# Patient Record
Sex: Female | Born: 1975 | Race: White | Hispanic: No | Marital: Married | Smoking: Never smoker
Health system: Southern US, Community
[De-identification: ages and names within clinical notes are randomized; demographics above are authoritative.]

## PROBLEM LIST (undated history)

## (undated) DIAGNOSIS — E079 Disorder of thyroid, unspecified: Secondary | ICD-10-CM

## (undated) DIAGNOSIS — R011 Cardiac murmur, unspecified: Secondary | ICD-10-CM

## (undated) DIAGNOSIS — F1910 Other psychoactive substance abuse, uncomplicated: Secondary | ICD-10-CM

## (undated) HISTORY — DX: Cardiac murmur, unspecified: R01.1

## (undated) HISTORY — DX: Other psychoactive substance abuse, uncomplicated: F19.10

## (undated) HISTORY — DX: Disorder of thyroid, unspecified: E07.9

---

## 2018-09-08 ENCOUNTER — Telehealth: Payer: Self-pay

## 2018-09-08 NOTE — Telephone Encounter (Signed)
Left message for patient to call and change appointment for e-visit.

## 2018-09-11 ENCOUNTER — Ambulatory Visit: Payer: Self-pay | Admitting: Physician Assistant

## 2018-11-10 ENCOUNTER — Other Ambulatory Visit: Payer: Self-pay

## 2018-11-10 ENCOUNTER — Other Ambulatory Visit (HOSPITAL_COMMUNITY)
Admission: RE | Admit: 2018-11-10 | Discharge: 2018-11-10 | Disposition: A | Discharge status: Home / Self Care | Payer: BC Managed Care – PPO | Source: Ambulatory Visit | Attending: Physician Assistant | Admitting: Physician Assistant

## 2018-11-10 ENCOUNTER — Other Ambulatory Visit: Payer: Self-pay | Admitting: Physician Assistant

## 2018-11-10 ENCOUNTER — Encounter: Payer: Self-pay | Admitting: Physician Assistant

## 2018-11-10 ENCOUNTER — Ambulatory Visit (INDEPENDENT_AMBULATORY_CARE_PROVIDER_SITE_OTHER): Payer: BC Managed Care – PPO | Admitting: Physician Assistant

## 2018-11-10 VITALS — BP 108/71 | HR 62 | Temp 98.6°F | Resp 16 | Ht 64.0 in | Wt 154.6 lb

## 2018-11-10 DIAGNOSIS — Z0000 Encounter for general adult medical examination without abnormal findings: Secondary | ICD-10-CM | POA: Diagnosis not present

## 2018-11-10 DIAGNOSIS — Z114 Encounter for screening for human immunodeficiency virus [HIV]: Secondary | ICD-10-CM | POA: Diagnosis not present

## 2018-11-10 DIAGNOSIS — Z124 Encounter for screening for malignant neoplasm of cervix: Secondary | ICD-10-CM | POA: Diagnosis present

## 2018-11-10 DIAGNOSIS — Z8639 Personal history of other endocrine, nutritional and metabolic disease: Secondary | ICD-10-CM

## 2018-11-10 DIAGNOSIS — N926 Irregular menstruation, unspecified: Secondary | ICD-10-CM | POA: Diagnosis not present

## 2018-11-10 NOTE — Patient Instructions (Signed)

## 2018-11-10 NOTE — Progress Notes (Signed)
Patient: Anna Richmond, Female    DOB: 11-13-1975, 43 y.o.   MRN: [id number] Visit Date: 11/10/2018  Today's Provider: Trey SailorsAdriana M Jaythan Hinely, PA-C   Chief Complaint  Patient presents with  . New Patient (Initial Visit)   Subjective:     Annual physical exam Anna Richmond is a 43 y.o. female who presents today for health maintenance and complete physical. She feels well. She reports exercising no. She reports she is sleeping well.  She works as a Comptrollertaxonomist. She is living at home with her husband. Does not have children.   She reports history of hyperthyroidism in 20's and did take medication for under 6 months but does not recall which one.  Most recently has had irregular periods. Negative home pregnancy test. Mostly vegan, currently taking iron supplements.   Mother deceased from lung cancer.   PAP: due  Wt Readings from Last 3 Encounters:  11/10/18 154 lb 9.6 oz (70.1 kg)   -----------------------------------------------------------------   Review of Systems  Constitutional: Negative.   HENT: Negative.   Eyes: Negative.   Respiratory: Negative.   Cardiovascular: Negative.   Gastrointestinal: Negative.   Endocrine: Negative.   Genitourinary: Negative.   Musculoskeletal: Negative.   Skin: Negative.   Allergic/Immunologic: Negative.   Neurological: Negative.   Hematological: Negative.   Psychiatric/Behavioral: Negative.     Social History      She  reports that she has never smoked. She has never used smokeless tobacco.       Social History   Socioeconomic History  . Marital status: Married    Spouse name: Not on file  . Number of children: Not on file  . Years of education: Not on file  . Highest education level: Not on file  Occupational History  . Not on file  Social Needs  . Financial resource strain: Not on file  . Food insecurity    Worry: Not on file    Inability: Not on file  . Transportation needs    Medical: Not on file   Non-medical: Not on file  Tobacco Use  . Smoking status: Never Smoker  . Smokeless tobacco: Never Used  Substance and Sexual Activity  . Alcohol use: Not on file  . Drug use: Not on file  . Sexual activity: Not on file  Lifestyle  . Physical activity    Days per week: Not on file    Minutes per session: Not on file  . Stress: Not on file  Relationships  . Social Musicianconnections    Talks on phone: Not on file    Gets together: Not on file    Attends religious service: Not on file    Active member of club or organization: Not on file    Attends meetings of clubs or organizations: Not on file    Relationship status: Not on file  Other Topics Concern  . Not on file  Social History Narrative  . Not on file    Past Medical History:  Diagnosis Date  . Heart murmur   . Thyroid disease      There are no active problems to display for this patient.   History reviewed. No pertinent surgical history.  Family History        Family Status  Relation Name Status  . Mother  Deceased at age 41       cancer lung  . Father  Alive  . MGM  Deceased  . PGM  Deceased  .  PGF  Deceased  . Brother  Alive  . Brother  Alive        Her family history includes Cancer in her mother; Dementia in her paternal grandfather; Diabetes in her paternal grandfather; Hyperlipidemia in her father; Hypertension in her maternal grandmother and paternal grandmother; Stroke in her paternal grandmother; Thyroid disease in her mother.      Allergies  Allergen Reactions  . Azithromycin Nausea And Vomiting  . Sulfa Antibiotics Swelling and Rash    No current outpatient medications on file.   Patient Care Team: Paulene Floor as PCP - General (Physician Assistant)    Objective:    Vitals: BP 108/71 (BP Location: Left Arm, Patient Position: Sitting, Cuff Size: Normal)   Pulse 62   Temp 98.6 F (37 C) (Oral)   Resp 16   Ht 5\' 4"  (1.626 m)   Wt 154 lb 9.6 oz (70.1 kg)   BMI 26.54 kg/m     Vitals:   11/10/18 1444  BP: 108/71  Pulse: 62  Resp: 16  Temp: 98.6 F (37 C)  TempSrc: Oral  Weight: 154 lb 9.6 oz (70.1 kg)  Height: 5\' 4"  (1.626 m)     Physical Exam Exam conducted with a chaperone present.  Constitutional:      Appearance: Normal appearance.  Cardiovascular:     Rate and Rhythm: Normal rate and regular rhythm.  Pulmonary:     Breath sounds: Normal breath sounds.  Chest:     Breasts:        Right: Normal.        Left: Normal.  Abdominal:     General: Abdomen is flat. Bowel sounds are normal.  Genitourinary:    Exam position: Lithotomy position.     Vagina: Normal.     Cervix: Normal.  Skin:    General: Skin is warm and dry.  Neurological:     Mental Status: She is alert and oriented to person, place, and time. Mental status is at baseline.  Psychiatric:        Mood and Affect: Mood normal.        Behavior: Behavior normal.      Depression Screen PHQ 2/9 Scores 11/10/2018  PHQ - 2 Score 1  PHQ- 9 Score 3       Assessment & Plan:     Routine Health Maintenance and Physical Exam  Exercise Activities and Dietary recommendations Goals   None     Immunization History  Administered Date(s) Administered  . Influenza-Unspecified 04/22/2018  . Tdap 04/22/2018    Health Maintenance  Topic Date Due  . HIV Screening  11/12/1992  . PAP SMEAR-Modifier  11/13/1998  . INFLUENZA VACCINE  12/23/2018  . TETANUS/TDAP  04/22/2028     Discussed health benefits of physical activity, and encouraged her to engage in regular exercise appropriate for her age and condition.    1. Annual physical exam  - CBC with Differential/Platelet - Comprehensive metabolic panel - Lipid Panel With LDL/HDL Ratio - TSH  2. Cervical cancer screening  - Cytology - PAP  3. Screening for HIV (human immunodeficiency virus)  - HIV Antibody (routine testing w rflx)  4. Irregular periods  - Beta HCG, Quant  5. History of hyperthyroidism  The  entirety of the information documented in the History of Present Illness, Review of Systems and Physical Exam were personally obtained by me. Portions of this information were initially documented by Lynford Humphrey, CMA and reviewed by me for thoroughness  and accuracy.   F/u 1 year or sooner if labs return abnormal.   --------------------------------------------------------------------    Trey SailorsAdriana M Deisha Stull, PA-C  [facility]

## 2018-11-11 LAB — CBC WITH DIFFERENTIAL/PLATELET
Basophils Absolute: 0 10*3/uL (ref 0.0–0.2)
Basos: 1 %
EOS (ABSOLUTE): 0.2 10*3/uL (ref 0.0–0.4)
Eos: 2 %
Hematocrit: 39.5 % (ref 34.0–46.6)
Hemoglobin: 13.6 g/dL (ref 11.1–15.9)
Immature Grans (Abs): 0 10*3/uL (ref 0.0–0.1)
Immature Granulocytes: 0 %
Lymphocytes Absolute: 3 10*3/uL (ref 0.7–3.1)
Lymphs: 37 %
MCH: 31.7 pg (ref 26.6–33.0)
MCHC: 34.4 g/dL (ref 31.5–35.7)
MCV: 92 fL (ref 79–97)
Monocytes Absolute: 0.7 10*3/uL (ref 0.1–0.9)
Monocytes: 8 %
Neutrophils Absolute: 4.3 10*3/uL (ref 1.4–7.0)
Neutrophils: 52 %
Platelets: 297 10*3/uL (ref 150–450)
RBC: 4.29 x10E6/uL (ref 3.77–5.28)
RDW: 12.6 % (ref 11.7–15.4)
WBC: 8.1 10*3/uL (ref 3.4–10.8)

## 2018-11-11 LAB — COMPREHENSIVE METABOLIC PANEL
ALT: 14 IU/L (ref 0–32)
AST: 20 IU/L (ref 0–40)
Albumin/Globulin Ratio: 2 (ref 1.2–2.2)
Albumin: 4.7 g/dL (ref 3.8–4.8)
Alkaline Phosphatase: 59 IU/L (ref 39–117)
BUN/Creatinine Ratio: 12 (ref 9–23)
BUN: 13 mg/dL (ref 6–24)
Bilirubin Total: 0.3 mg/dL (ref 0.0–1.2)
CO2: 20 mmol/L (ref 20–29)
Calcium: 9.6 mg/dL (ref 8.7–10.2)
Chloride: 103 mmol/L (ref 96–106)
Creatinine, Ser: 1.06 mg/dL — ABNORMAL HIGH (ref 0.57–1.00)
GFR calc Af Amer: 76 mL/min/{1.73_m2} (ref >59)
GFR calc non Af Amer: 66 mL/min/{1.73_m2} (ref >59)
Globulin, Total: 2.4 g/dL (ref 1.5–4.5)
Glucose: 83 mg/dL (ref 65–99)
Potassium: 4.1 mmol/L (ref 3.5–5.2)
Sodium: 136 mmol/L (ref 134–144)
Total Protein: 7.1 g/dL (ref 6.0–8.5)

## 2018-11-11 LAB — LIPID PANEL WITH LDL/HDL RATIO
Cholesterol, Total: 186 mg/dL (ref 100–199)
HDL: 38 mg/dL — ABNORMAL LOW (ref >39)
LDL Calculated: 119 mg/dL — ABNORMAL HIGH (ref 0–99)
LDl/HDL Ratio: 3.1 ratio (ref 0.0–3.2)
Triglycerides: 145 mg/dL (ref 0–149)
VLDL Cholesterol Cal: 29 mg/dL (ref 5–40)

## 2018-11-11 LAB — TSH: TSH: 5.81 u[IU]/mL — ABNORMAL HIGH (ref 0.450–4.500)

## 2018-11-11 LAB — BETA HCG QUANT (REF LAB): hCG Quant: <1 m[IU]/mL

## 2018-11-11 LAB — HIV ANTIBODY (ROUTINE TESTING W REFLEX): HIV Screen 4th Generation wRfx: NONREACTIVE

## 2018-11-14 ENCOUNTER — Telehealth: Payer: Self-pay | Admitting: Physician Assistant

## 2018-11-14 ENCOUNTER — Telehealth: Payer: Self-pay

## 2018-11-14 LAB — CYTOLOGY - PAP
Diagnosis: NEGATIVE
HPV: NOT DETECTED

## 2018-11-14 NOTE — Telephone Encounter (Signed)
Pt returned call. This message regarding PAP and HPV test was relayed. Pt understood and did not have further questions.  TF

## 2018-11-14 NOTE — Telephone Encounter (Signed)
-----   Message from Adriana M Pollak, PA-C sent at 11/14/2018 11:59 AM EDT ----- °Normal PAP and negative HPV. Repeat 5 years. °

## 2018-11-14 NOTE — Telephone Encounter (Signed)
No answer. Left v/m. Called to relay message TF

## 2018-11-14 NOTE — Telephone Encounter (Signed)
-----   Message from Trinna Post, [place] sent at 11/13/2018  4:45 PM EDT ----- Can we please add on Free T3 and free T4 under dx elevated TSH.

## 2018-11-14 NOTE — Telephone Encounter (Signed)
Test added.   

## 2018-11-14 NOTE — Telephone Encounter (Signed)
-----   Message from Trinna Post, [place] sent at 11/14/2018 11:59 AM EDT ----- Normal PAP and negative HPV. Repeat 5 years.

## 2018-11-15 ENCOUNTER — Telehealth: Payer: Self-pay

## 2018-11-15 MED ORDER — LEVOTHYROXINE SODIUM 25 MCG PO TABS: 25.0000 ug | ORAL_TABLET | Freq: Every day | ORAL | 0 refills | Status: DC

## 2018-11-15 NOTE — Telephone Encounter (Signed)
Patient advised as below. Patient verbalizes understanding and is in agreement with treatment plan.  

## 2018-11-15 NOTE — Telephone Encounter (Signed)
-----   Message from Trinna Post, [place] sent at 11/15/2018  8:52 AM EDT ----- Her thyroid tests showed subclinical hypothyroidism, which is a mild form of hypothyroidism. I think given her history, she might benefit from treatment. If she is agreeable, I will send in levothyroxine 25 mcg and see her in follow up in 4 weeks. If she continues to have irregular periods, I think we should run a few more blood tests and get an ultrasound.

## 2018-11-16 LAB — T4, FREE: Free T4: 0.91 ng/dL (ref 0.82–1.77)

## 2018-11-16 LAB — SPECIMEN STATUS REPORT

## 2018-11-16 LAB — T3, FREE: T3, Free: 2.7 pg/mL (ref 2.0–4.4)

## 2018-12-12 NOTE — Progress Notes (Signed)
       Patient: Anna Richmond Female    DOB: 1975/08/01   43 y.o.   MRN: [id number] Visit Date: 12/12/2018  Today's Provider: Trinna Post, PA-C   Chief Complaint  Patient presents with  . Follow-up   Subjective:     HPI  Virtual Visit via Video Note  I connected with Anna Richmond on 12/12/18 at  8:20 AM EDT by a video enabled telemedicine application and verified that I am speaking with the correct person using two identifiers.  Location: Patient: Home Provider: Home   I discussed the limitations of evaluation and management by telemedicine and the availability of in person appointments. The patient expressed understanding and agreed to proceed.  Subclinical Hypothyroidism: started synthroid 25 mcg daily. She is taking it consistently on an empty stomach 30 minutes before meal with a glass of water. Reports she had some hot flashes initially but this has resolved and she feels well taking the medication.   Lab Results  Component Value Date   TSH 5.810 (H) 11/10/2018       Allergies  Allergen Reactions  . Azithromycin Nausea And Vomiting  . Sulfa Antibiotics Swelling and Rash     Current Outpatient Medications:  .  levothyroxine (SYNTHROID) 25 MCG tablet, Take 1 tablet (25 mcg total) by mouth daily before breakfast., Disp: 30 tablet, Rfl: 0  Review of Systems  All other systems reviewed and are negative.   Social History   Tobacco Use  . Smoking status: Never Smoker  . Smokeless tobacco: Never Used  Substance Use Topics  . Alcohol use: Not on file      Objective:   There were no vitals taken for this visit. There were no vitals filed for this visit.   Physical Exam Constitutional:      Appearance: Normal appearance.  Neurological:     Mental Status: She is alert.  Psychiatric:        Mood and Affect: Mood normal.        Behavior: Behavior normal.      No results found for any visits on 12/13/18.     Assessment & Plan    1.  Hypothyroidism, unspecified type  Started on 25 mcg synthroid. If TSH normal, continue and f/u 1 year. If abnormal, change dose and follow up ~ 3 months. She will continue to monitor her menstrual cycle and call back if irregularities persist.   - TSH  The entirety of the information documented in the History of Present Illness, Review of Systems and Physical Exam were personally obtained by me. Portions of this information were initially documented by Doran Clay, LPN and reviewed by me for thoroughness and accuracy.          Trinna Post, PA-C  [facility]

## 2018-12-13 ENCOUNTER — Ambulatory Visit (INDEPENDENT_AMBULATORY_CARE_PROVIDER_SITE_OTHER): Payer: BC Managed Care – PPO | Admitting: Physician Assistant

## 2018-12-13 ENCOUNTER — Other Ambulatory Visit: Payer: Self-pay

## 2018-12-13 DIAGNOSIS — E039 Hypothyroidism, unspecified: Secondary | ICD-10-CM | POA: Diagnosis not present

## 2018-12-13 NOTE — Patient Instructions (Signed)
Hypothyroidism  Hypothyroidism is when the thyroid gland does not make enough of certain hormones (it is underactive). The thyroid gland is a small gland located in the lower front part of the neck, just in front of the windpipe (trachea). This gland makes hormones that help control how the body uses food for energy (metabolism) as well as how the heart and brain function. These hormones also play a role in keeping your bones strong. When the thyroid is underactive, it produces too little of the hormones thyroxine (T4) and triiodothyronine (T3). What are the causes? This condition may be caused by:  Hashimoto's disease. This is a disease in which the body's disease-fighting system (immune system) attacks the thyroid gland. This is the most common cause.  Viral infections.  Pregnancy.  Certain medicines.  Birth defects.  Past radiation treatments to the head or neck for cancer.  Past treatment with radioactive iodine.  Past exposure to radiation in the environment.  Past surgical removal of part or all of the thyroid.  Problems with a gland in the center of the brain (pituitary gland).  Lack of enough iodine in the diet. What increases the risk? You are more likely to develop this condition if:  You are female.  You have a family history of thyroid conditions.  You use a medicine called lithium.  You take medicines that affect the immune system (immunosuppressants). What are the signs or symptoms? Symptoms of this condition include:  Feeling as though you have no energy (lethargy).  Not being able to tolerate cold.  Weight gain that is not explained by a change in diet or exercise habits.  Lack of appetite.  Dry skin.  Coarse hair.  Menstrual irregularity.  Slowing of thought processes.  Constipation.  Sadness or depression. How is this diagnosed? This condition may be diagnosed based on:  Your symptoms, your medical history, and a physical exam.  Blood  tests. You may also have imaging tests, such as an ultrasound or MRI. How is this treated? This condition is treated with medicine that replaces the thyroid hormones that your body does not make. After you begin treatment, it may take several weeks for symptoms to go away. Follow these instructions at home:  Take over-the-counter and prescription medicines only as told by your health care provider.  If you start taking any new medicines, tell your health care provider.  Keep all follow-up visits as told by your health care provider. This is important. ? As your condition improves, your dosage of thyroid hormone medicine may change. ? You will need to have blood tests regularly so that your health care provider can monitor your condition. Contact a health care provider if:  Your symptoms do not get better with treatment.  You are taking thyroid replacement medicine and you: ? Sweat a lot. ? Have tremors. ? Feel anxious. ? Lose weight rapidly. ? Cannot tolerate heat. ? Have emotional swings. ? Have diarrhea. ? Feel weak. Get help right away if you have:  Chest pain.  An irregular heartbeat.  A rapid heartbeat.  Difficulty breathing. Summary  Hypothyroidism is when the thyroid gland does not make enough of certain hormones (it is underactive).  When the thyroid is underactive, it produces too little of the hormones thyroxine (T4) and triiodothyronine (T3).  The most common cause is Hashimoto's disease, a disease in which the body's disease-fighting system (immune system) attacks the thyroid gland. The condition can also be caused by viral infections, medicine, pregnancy, or past   radiation treatment to the head or neck.  Symptoms may include weight gain, dry skin, constipation, feeling as though you do not have energy, and not being able to tolerate cold.  This condition is treated with medicine to replace the thyroid hormones that your body does not make. This information  is not intended to replace advice given to you by your health care provider. Make sure you discuss any questions you have with your health care provider. Document Released: 05/10/2005 Document Revised: 04/22/2017 Document Reviewed: 04/20/2017 Elsevier Patient Education  2020 [employer]  

## 2018-12-14 ENCOUNTER — Other Ambulatory Visit: Payer: Self-pay | Admitting: Physician Assistant

## 2018-12-14 LAB — TSH: TSH: 2.7 u[IU]/mL (ref 0.450–4.500)

## 2018-12-14 MED ORDER — LEVOTHYROXINE SODIUM 25 MCG PO TABS: 25.0000 ug | ORAL_TABLET | Freq: Every day | ORAL | 2 refills | Status: DC

## 2019-01-19 ENCOUNTER — Other Ambulatory Visit: Payer: Self-pay

## 2019-01-19 DIAGNOSIS — Z20822 Contact with and (suspected) exposure to covid-19: Secondary | ICD-10-CM

## 2019-01-21 LAB — NOVEL CORONAVIRUS, NAA: SARS-CoV-2, NAA: NOT DETECTED

## 2019-03-21 ENCOUNTER — Encounter: Payer: Self-pay | Admitting: Physician Assistant

## 2019-03-21 DIAGNOSIS — E039 Hypothyroidism, unspecified: Secondary | ICD-10-CM

## 2019-03-27 LAB — TSH: TSH: 3.91 u[IU]/mL (ref 0.450–4.500)

## 2019-03-30 ENCOUNTER — Encounter: Payer: Self-pay | Admitting: Physician Assistant

## 2019-03-30 ENCOUNTER — Ambulatory Visit: Payer: BC Managed Care – PPO | Admitting: Physician Assistant

## 2019-03-30 ENCOUNTER — Other Ambulatory Visit: Payer: Self-pay

## 2019-03-30 VITALS — BP 105/51 | HR 72 | Temp 97.5°F | Wt 154.4 lb

## 2019-03-30 DIAGNOSIS — N926 Irregular menstruation, unspecified: Secondary | ICD-10-CM

## 2019-03-30 DIAGNOSIS — E039 Hypothyroidism, unspecified: Secondary | ICD-10-CM

## 2019-03-30 DIAGNOSIS — Z23 Encounter for immunization: Secondary | ICD-10-CM

## 2019-03-30 NOTE — Patient Instructions (Signed)
Hypothyroidism  Hypothyroidism is when the thyroid gland does not make enough of certain hormones (it is underactive). The thyroid gland is a small gland located in the lower front part of the neck, just in front of the windpipe (trachea). This gland makes hormones that help control how the body uses food for energy (metabolism) as well as how the heart and brain function. These hormones also play a role in keeping your bones strong. When the thyroid is underactive, it produces too little of the hormones thyroxine (T4) and triiodothyronine (T3). What are the causes? This condition may be caused by:  Hashimoto's disease. This is a disease in which the body's disease-fighting system (immune system) attacks the thyroid gland. This is the most common cause.  Viral infections.  Pregnancy.  Certain medicines.  Birth defects.  Past radiation treatments to the head or neck for cancer.  Past treatment with radioactive iodine.  Past exposure to radiation in the environment.  Past surgical removal of part or all of the thyroid.  Problems with a gland in the center of the brain (pituitary gland).  Lack of enough iodine in the diet. What increases the risk? You are more likely to develop this condition if:  You are female.  You have a family history of thyroid conditions.  You use a medicine called lithium.  You take medicines that affect the immune system (immunosuppressants). What are the signs or symptoms? Symptoms of this condition include:  Feeling as though you have no energy (lethargy).  Not being able to tolerate cold.  Weight gain that is not explained by a change in diet or exercise habits.  Lack of appetite.  Dry skin.  Coarse hair.  Menstrual irregularity.  Slowing of thought processes.  Constipation.  Sadness or depression. How is this diagnosed? This condition may be diagnosed based on:  Your symptoms, your medical history, and a physical exam.  Blood  tests. You may also have imaging tests, such as an ultrasound or MRI. How is this treated? This condition is treated with medicine that replaces the thyroid hormones that your body does not make. After you begin treatment, it may take several weeks for symptoms to go away. Follow these instructions at home:  Take over-the-counter and prescription medicines only as told by your health care provider.  If you start taking any new medicines, tell your health care provider.  Keep all follow-up visits as told by your health care provider. This is important. ? As your condition improves, your dosage of thyroid hormone medicine may change. ? You will need to have blood tests regularly so that your health care provider can monitor your condition. Contact a health care provider if:  Your symptoms do not get better with treatment.  You are taking thyroid replacement medicine and you: ? Sweat a lot. ? Have tremors. ? Feel anxious. ? Lose weight rapidly. ? Cannot tolerate heat. ? Have emotional swings. ? Have diarrhea. ? Feel weak. Get help right away if you have:  Chest pain.  An irregular heartbeat.  A rapid heartbeat.  Difficulty breathing. Summary  Hypothyroidism is when the thyroid gland does not make enough of certain hormones (it is underactive).  When the thyroid is underactive, it produces too little of the hormones thyroxine (T4) and triiodothyronine (T3).  The most common cause is Hashimoto's disease, a disease in which the body's disease-fighting system (immune system) attacks the thyroid gland. The condition can also be caused by viral infections, medicine, pregnancy, or past   radiation treatment to the head or neck.  Symptoms may include weight gain, dry skin, constipation, feeling as though you do not have energy, and not being able to tolerate cold.  This condition is treated with medicine to replace the thyroid hormones that your body does not make. This information  is not intended to replace advice given to you by your health care provider. Make sure you discuss any questions you have with your health care provider. Document Released: 05/10/2005 Document Revised: 04/22/2017 Document Reviewed: 04/20/2017 Elsevier Patient Education  2020 [employer]  

## 2019-03-30 NOTE — Progress Notes (Signed)
Patient: Anna Richmond Female    DOB: Apr 12, 1976   43 y.o.   MRN: [id number] Visit Date: 03/30/2019  Today's Provider: Trinna Post, PA-C   Chief Complaint  Patient presents with  . Hypothyroidism   Subjective:     HPI  Hypothyroid, follow-up:  TSH  Date Value Ref Range Status  03/26/2019 3.910 0.450 - 4.500 uIU/mL Final  12/13/2018 2.700 0.450 - 4.500 uIU/mL Final  11/10/2018 5.810 (H) 0.450 - 4.500 uIU/mL Final   Wt Readings from Last 3 Encounters:  03/30/19 154 lb 6.4 oz (70 kg)  11/10/18 154 lb 9.6 oz (70.1 kg)    She was last seen for hypothyroid 4 months ago.  Management since that visit includes continue Synthroid 25 mcg. She reports good compliance with treatment. She is not having side effects.  She is not exercising. She is experiencing none She denies change in energy level, diarrhea, nervousness and palpitations Weight trend: stable  Continues to have irregular menstrual bleeding with spotting.  ------------------------------------------------------------------------     Allergies  Allergen Reactions  . Azithromycin Nausea And Vomiting  . Sulfa Antibiotics Swelling and Rash     Current Outpatient Medications:  .  levothyroxine (SYNTHROID) 25 MCG tablet, Take 1 tablet (25 mcg total) by mouth daily before breakfast., Disp: 90 tablet, Rfl: 2  Review of Systems  Constitutional: Negative.   Respiratory: Negative.   Genitourinary: Negative.   Neurological: Negative.     Social History   Tobacco Use  . Smoking status: Never Smoker  . Smokeless tobacco: Never Used  Substance Use Topics  . Alcohol use: Not on file      Objective:   BP (!) 105/51 (BP Location: Left Arm, Patient Position: Sitting, Cuff Size: Normal)   Pulse 72   Temp (!) 97.5 F (36.4 C) (Temporal)   Wt 154 lb 6.4 oz (70 kg)   LMP 03/01/2019 (Exact Date)   BMI 26.50 kg/m  Vitals:   03/30/19 1446  BP: (!) 105/51  Pulse: 72  Temp: (!) 97.5 F  (36.4 C)  TempSrc: Temporal  Weight: 154 lb 6.4 oz (70 kg)  Body mass index is 26.5 kg/m.   Physical Exam Constitutional:      Appearance: Normal appearance.  Cardiovascular:     Rate and Rhythm: Normal rate.     Heart sounds: Normal heart sounds.  Pulmonary:     Effort: Pulmonary effort is normal.  Skin:    General: Skin is warm and dry.  Neurological:     Mental Status: She is alert and oriented to person, place, and time. Mental status is at baseline.  Psychiatric:        Mood and Affect: Mood normal.        Behavior: Behavior normal.      No results found for any visits on 03/30/19.     Assessment & Plan    1. Hypothyroidism, unspecified type  Normal, no dose adjustment necessary.  2. Irregular menstrual bleeding  Would suggest she see a specialist, referral has been made.   - Ambulatory referral to Obstetrics / Gynecology  3. Need for influenza vaccination  - Flu Vaccine QUAD 6+ mos PF IM (Fluarix Quad PF)  The entirety of the information documented in the History of Present Illness, Review of Systems and Physical Exam were personally obtained by me. Portions of this information were initially documented by [facility], CMA and reviewed by me for thoroughness and accuracy.   F/u  PRN      Trey Sailors, PA-C  [facility]

## 2019-04-02 ENCOUNTER — Telehealth: Payer: Self-pay | Admitting: Obstetrics & Gynecology

## 2019-04-02 NOTE — Telephone Encounter (Signed)
[facility] referring for Irregular menstrual bleeding. Called and left voicemail for patient to call back to be schedule

## 2019-04-08 NOTE — Progress Notes (Signed)
Trey Sailors, PA-C   Chief Complaint  Patient presents with  . Metrorrhagia    had spotting in between last two cycles, not every month, no abnormal pain or heavy flows    HPI:      [name] is a 43 y.o.  who LMP was Patient's last menstrual period was 03/01/2019 (exact date)., presents today for NP eval of irregular bleeding for 2 cycles, referred by PCP. Menses usually Q29-40 days now (used to be Q30 days), last 4 days, mod flow, mild dysmen for 1 day, improved with NSAIDs/heating pad. Had mid cycle BTB 3/20 but also with subclinical hypothyroidism at that time. Now on syntrhroid 25 mcg dailly with normal thyroid 11/20. Had sx again 10/20 menses. BTB was 10 days, light, dark blood. No dysmen.  Pt is sex active, no BC. Never been pregnant. No dyspareunia, postcoital bleeding. Did OCPs, patch, nuvaring in 20s without side effects. No hx of HTN, DVTs, migraines with aura, tobacco use.  No FH breast/ovar ca. Had incidental finding of ovar cyst on CT in past. Normal pap 6/20.  Patient Active Problem List   Diagnosis Date Noted  . Thyroid disease   . Heart murmur     History reviewed. No pertinent surgical history.  Family History  Problem Relation Age of Onset  . Cancer Mother   . Thyroid disease Mother   . Hyperlipidemia Father   . Hypertension Maternal Grandmother   . Hypertension Paternal Grandmother   . Stroke Paternal Grandmother   . Diabetes Paternal Grandfather   . Dementia Paternal Grandfather     Social History   Socioeconomic History  . Marital status: Married    Spouse name: Not on file  . Number of children: Not on file  . Years of education: Not on file  . Highest education level: Not on file  Occupational History  . Not on file  Social Needs  . Financial resource strain: Not on file  . Food insecurity    Worry: Not on file    Inability: Not on file  . Transportation needs    Medical: Not on file    Non-medical: Not on file   Tobacco Use  . Smoking status: Never Smoker  . Smokeless tobacco: Never Used  Substance and Sexual Activity  . Alcohol use: Yes  . Drug use: Never  . Sexual activity: Yes    Birth control/protection: None  Lifestyle  . Physical activity    Days per week: Not on file    Minutes per session: Not on file  . Stress: Not on file  Relationships  . Social Musician on phone: Not on file    Gets together: Not on file    Attends religious service: Not on file    Active member of club or organization: Not on file    Attends meetings of clubs or organizations: Not on file    Relationship status: Not on file  . Intimate partner violence    Fear of current or ex partner: Not on file    Emotionally abused: Not on file    Physically abused: Not on file    Forced sexual activity: Not on file  Other Topics Concern  . Not on file  Social History Narrative  . Not on file    Outpatient Medications Prior to Visit  Medication Sig Dispense Refill  . levothyroxine (SYNTHROID) 25 MCG tablet Take 1 tablet (25 mcg total) by mouth  daily before breakfast. 90 tablet 2   No facility-administered medications prior to visit.      ROS:  Review of Systems  Constitutional: Positive for fatigue. Negative for fever and unexpected weight change.  Respiratory: Negative for cough, shortness of breath and wheezing.   Cardiovascular: Negative for chest pain, palpitations and leg swelling.  Gastrointestinal: Positive for constipation. Negative for blood in stool, diarrhea, nausea and vomiting.  Endocrine: Negative for cold intolerance, heat intolerance and polyuria.  Genitourinary: Positive for vaginal bleeding. Negative for dyspareunia, dysuria, flank pain, frequency, genital sores, hematuria, menstrual problem, pelvic pain, urgency, vaginal discharge and vaginal pain.  Musculoskeletal: Negative for back pain, joint swelling and myalgias.  Skin: Negative for rash.  Neurological: Negative for  dizziness, syncope, light-headedness, numbness and headaches.  Hematological: Negative for adenopathy.  Psychiatric/Behavioral: Negative for agitation, confusion, sleep disturbance and suicidal ideas. The patient is not nervous/anxious.   BREAST: No symptoms   OBJECTIVE:   Vitals:  BP 90/60   Ht 5\' 4"  (1.626 m)   Wt 156 lb (70.8 kg)   LMP 03/01/2019 (Exact Date)   BMI 26.78 kg/m   Physical Exam Vitals signs reviewed.  Constitutional:      Appearance: She is well-developed.  Neck:     Musculoskeletal: Normal range of motion.  Pulmonary:     Effort: Pulmonary effort is normal.  Genitourinary:    General: Normal vulva.     Pubic Area: No rash.      Labia:        Right: No rash, tenderness or lesion.        Left: No rash, tenderness or lesion.      Vagina: Normal. No vaginal discharge, erythema or tenderness.     Cervix: Normal.     Uterus: Normal. Not enlarged and not tender.      Adnexa: Right adnexa normal and left adnexa normal.       Right: No mass or tenderness.         Left: No mass or tenderness.    Musculoskeletal: Normal range of motion.  Skin:    General: Skin is warm and dry.  Neurological:     General: No focal deficit present.     Mental Status: She is alert and oriented to person, place, and time.  Psychiatric:        Mood and Affect: Mood normal.        Behavior: Behavior normal.        Thought Content: Thought content normal.        Judgment: Judgment normal.     Results: Results for orders placed or performed in visit on 04/09/19 (from the past 24 hour(s))  POCT urine pregnancy     Status: Normal   Collection Time: 04/09/19  9:53 AM  Result Value Ref Range   Preg Test, Ur Negative Negative   ULTRASOUND REPORT  Location: [facility]  Date of Service: 04/09/2019    Indications:Abnormal Uterine Bleeding Findings:  The uterus is anteverted and measures 6.0 x 3.4 x 2.9 cm. Echo texture is homogenous without evidence of focal masses.   The Endometrium measures 2.9 mm.  Right Ovary measures 2.7 x 1.9 x 1.0 cm. It is normal in appearance. Left Ovary measures 2.5 x 2.6 x 1.1 cm. It is normal in appearance. Survey of the adnexa demonstrates no adnexal masses. There is no free fluid in the cul de sac.  Impression: 1. Normal pelvic ultrasound.   Recommendations: 1.Clinical correlation with the patient's  History and Physical Exam.   Deanna ArtisElyse S Fairbanks, RT  Assessment/Plan: Abnormal uterine bleeding (AUB) - Plan: US Transvaginal Non-OB, POCT urine pregnancy; Neg UPT/neg Gyn u/s. Sx twice, once when thyroid abnormal and once since euthyroid. Could be stress-related. Can follow cycles vs BC for cycle control. Pt wants to watch and wait for now. F/u prn.     Return if symptoms worsen or fail to improve.  Jermario Kalmar B. Michaeal Davis, PA-C 04/09/2019 4:07 PM

## 2019-04-09 ENCOUNTER — Other Ambulatory Visit: Payer: Self-pay

## 2019-04-09 ENCOUNTER — Encounter: Payer: Self-pay | Admitting: Obstetrics and Gynecology

## 2019-04-09 ENCOUNTER — Ambulatory Visit (INDEPENDENT_AMBULATORY_CARE_PROVIDER_SITE_OTHER): Payer: BC Managed Care – PPO | Admitting: Obstetrics and Gynecology

## 2019-04-09 ENCOUNTER — Ambulatory Visit (INDEPENDENT_AMBULATORY_CARE_PROVIDER_SITE_OTHER): Payer: BC Managed Care – PPO

## 2019-04-09 VITALS — BP 90/60 | Ht 64.0 in | Wt 156.0 lb

## 2019-04-09 DIAGNOSIS — R011 Cardiac murmur, unspecified: Secondary | ICD-10-CM | POA: Insufficient documentation

## 2019-04-09 DIAGNOSIS — N939 Abnormal uterine and vaginal bleeding, unspecified: Secondary | ICD-10-CM | POA: Diagnosis not present

## 2019-04-09 DIAGNOSIS — Z3202 Encounter for pregnancy test, result negative: Secondary | ICD-10-CM | POA: Diagnosis not present

## 2019-04-09 DIAGNOSIS — E079 Disorder of thyroid, unspecified: Secondary | ICD-10-CM | POA: Insufficient documentation

## 2019-04-09 LAB — POCT URINE PREGNANCY: Preg Test, Ur: NEGATIVE

## 2019-04-09 NOTE — Patient Instructions (Signed)
I value your feedback and entrusting us with your care. If you get a [facility] patient survey, I would appreciate you taking the time to let us know about your experience today. Thank you! 

## 2019-06-06 ENCOUNTER — Telehealth: Payer: BC Managed Care – PPO | Admitting: Nurse Practitioner

## 2019-06-06 DIAGNOSIS — K591 Functional diarrhea: Secondary | ICD-10-CM

## 2019-06-06 DIAGNOSIS — Z20822 Contact with and (suspected) exposure to covid-19: Secondary | ICD-10-CM

## 2019-06-06 DIAGNOSIS — R52 Pain, unspecified: Secondary | ICD-10-CM

## 2019-06-06 DIAGNOSIS — R5081 Fever presenting with conditions classified elsewhere: Secondary | ICD-10-CM

## 2019-06-06 NOTE — Progress Notes (Signed)
E-Visit for Corona Virus Screening  Your current symptoms could be consistent with the coronavirus.  Many health care providers can now test patients at their office but not all are.  [facility] has multiple testing sites. For information on our [place] testing locations and hours go to [email or web address]  We are enrolling you in our [place] for [place] . Daily you will receive a questionnaire within the [facility] [facility] website. Our COVID 19 response team will be monitoring your responses daily.  Testing Information: The COVID-19 Community Testing sites will begin testing BY APPOINTMENT ONLY.  You can schedule online at [email or web address]  If you do not have access to a smart phone or computer you may call [phone] for an appointment.  Testing Locations: Appointment schedule is 8 am to 3:30 pm at all sites  [facility] indoors at [address] [place] [address] [facility]  indoors at [place]. [address], [place], [place] [address] [place] indoors at [address], [place] [address]  Additional testing sites in the Community:  . For [facility] Testing sites in [facility]  FaceUpdate.uy  . For Pop-up testing sites in [place]  BowlDirectory.co.uk  . For Testing sites with regular hours https://onsms.org/[email or web address]  . For [place] [email or web address].si  . For [facility] BasicJet.ca  . For [facility] testing in [place] and [employer] BasicJet.[email or web address]  . For [facility] testing in [facility]   https://lhi.care/[email or web address]  For  more information  about community testing call [phone]   We are enrolling you in our [place] for [place] . Daily you will receive a questionnaire within the [place] website. Our COVID 19 response team will be monitoring your responses daily.  Please quarantine yourself while awaiting your test results. Please stay home for a minimum of 10 days from the first day of illness with improving symptoms and you have had 24 hours of no fever (without the use of Tylenol (Acetaminophen) Motrin (Ibuprofen) or any fever reducing medication).  Also - Do not get tested prior to returning to [facility] because once you have had a positive test the test can stay positive for more then a month in some cases.   You should wear a mask or cloth face covering over your nose and mouth if you must be around other people or animals, including pets (even at home). Try to stay at least 6 feet away from other people. This will protect the people around you.  Please continue good preventive care measures, including:  frequent hand-washing, avoid touching your face, cover coughs/sneezes, stay out of crowds and keep a 6 foot distance from others.  COVID-19 is a respiratory illness with symptoms that are similar to the flu. Symptoms are typically mild to moderate, but there have been cases of severe illness and death due to the virus.   The following symptoms may appear 2-14 days after exposure: . Fever . Cough . Shortness of breath or difficulty breathing . Chills . Repeated shaking with chills . Muscle pain . Headache . Sore throat . New loss of taste or smell . Fatigue . Congestion or runny nose . Nausea or vomiting . Diarrhea  Go to the nearest hospital ED for assessment if fever/cough/breathlessness are severe or illness seems like a threat to life.  It is vitally important that if you feel that you have an infection such as this virus or any other virus that you stay home and away from places  where you may spread it to  others.  You should avoid contact with people age 48 and older.   You can use medication such as delsym if develop a cough  You may also take acetaminophen (Tylenol) as needed for fever.  Reduce your risk of any infection by using the same precautions used for avoiding the common cold or flu:  Marland Kitchen Wash your hands often with soap and warm water for at least 20 seconds.  If soap and water are not readily available, use an alcohol-based hand sanitizer with at least 60% alcohol.  . If coughing or sneezing, cover your mouth and nose by coughing or sneezing into the elbow areas of your shirt or coat, into a tissue or into your sleeve (not your hands). . Avoid shaking hands with others and consider head nods or verbal greetings only. . Avoid touching your eyes, nose, or mouth with unwashed hands.  . Avoid close contact with people who are sick. . Avoid places or events with large numbers of people in one location, like concerts or sporting events. . Carefully consider travel plans you have or are making. . If you are planning any travel outside or inside the [place], visit the CDC's Travelers' Health webpage for the latest health notices. . If you have some symptoms but not all symptoms, continue to monitor at home and seek medical attention if your symptoms worsen. . If you are having a medical emergency, call 911.  HOME CARE . Only take medications as instructed by your medical team. . Drink plenty of fluids and get plenty of rest. . A steam or ultrasonic humidifier can help if you have congestion.   GET HELP RIGHT AWAY IF YOU HAVE EMERGENCY WARNING SIGNS** FOR COVID-19. If you or someone is showing any of these signs seek emergency medical care immediately. Call 911 or proceed to your closest emergency facility if: . You develop worsening high fever. . Trouble breathing . Bluish lips or face . Persistent pain or pressure in the chest . New confusion . Inability to wake or stay awake . You cough up  blood. . Your symptoms become more severe  **This list is not all possible symptoms. Contact your medical provider for any symptoms that are sever or concerning to you.  MAKE SURE YOU   Understand these instructions.  Will watch your condition.  Will get help right away if you are not doing well or get worse.  Your e-visit answers were reviewed by a board certified advanced clinical practitioner to complete your personal care plan.  Depending on the condition, your plan could have included both over the counter or prescription medications.  If there is a problem please reply once you have received a response from your provider.  Your safety is important to [place].  If you have drug allergies check your prescription carefully.    You can use MyChart to ask questions about today's visit, request a non-urgent call back, or ask for a work or school excuse for 24 hours related to this e-Visit. If it has been greater than 24 hours you will need to follow up with your provider, or enter a new e-Visit to address those concerns. You will get an e-mail in the next two days asking about your experience.  I hope that your e-visit has been valuable and will speed your recovery. Thank you for using e-visits.   5-10 minutes spent reviewing and documenting in chart.

## 2019-06-07 ENCOUNTER — Ambulatory Visit: Payer: BC Managed Care – PPO | Attending: Internal Medicine

## 2019-06-07 DIAGNOSIS — Z20822 Contact with and (suspected) exposure to covid-19: Secondary | ICD-10-CM

## 2019-06-09 LAB — NOVEL CORONAVIRUS, NAA: SARS-CoV-2, NAA: NOT DETECTED

## 2019-06-13 ENCOUNTER — Ambulatory Visit (INDEPENDENT_AMBULATORY_CARE_PROVIDER_SITE_OTHER)
Admission: RE | Admit: 2019-06-13 | Discharge: 2019-06-13 | Disposition: A | Discharge status: Home / Self Care | Payer: BC Managed Care – PPO | Source: Ambulatory Visit

## 2019-06-13 DIAGNOSIS — B9789 Other viral agents as the cause of diseases classified elsewhere: Secondary | ICD-10-CM | POA: Diagnosis not present

## 2019-06-13 DIAGNOSIS — J0100 Acute maxillary sinusitis, unspecified: Secondary | ICD-10-CM | POA: Diagnosis not present

## 2019-06-13 MED ORDER — AMOXICILLIN-POT CLAVULANATE 875-125 MG PO TABS: 1.0000 | ORAL_TABLET | Freq: Two times a day (BID) | ORAL | 0 refills | Status: DC

## 2019-06-13 NOTE — ED Provider Notes (Addendum)
Virtual Visit via Video Note:  Anna Richmond  initiated request for Telemedicine visit with [facility] team. I connected with Anna Richmond  on 06/13/2019 at 1027  for a synchronized telemedicine visit using a video enabled HIPPA compliant telemedicine application. I verified that I am speaking with Anna Richmond  using two identifiers. Durward Parcel, FNP  was physically located in a [facility] Urgent care site and [facility] Tamorah Hada was located at a different location.   The limitations of evaluation and management by telemedicine as well as the availability of in-person appointments were discussed. Patient was informed that she  may incur a bill ( including co-pay) for this virtual visit encounter. Anna Richmond  expressed understanding and gave verbal consent to proceed with virtual visit.     History of Present Illness:Anna Richmond  is a 44 y.o. female present via video refill complaint of sinus pressure and sinus pain for the past 5 days.  Reports yellowish-greenish discharge.  Patient tested negative for COVID-19  7 days ago.  Has been using Sudafed and Flonase without relief.  Denies sick exposure to COVID, flu or strep.  Denies recent travel.  Denies aggravating or alleviating symptoms.  Denies previous COVID infection.   Denies fever, chills, fatigue, sore throat, cough, SOB, wheezing, chest pain, nausea, vomiting, changes in bowel or bladder habits.    Past Medical History:  Diagnosis Date  . Heart murmur   . Thyroid disease     Allergies  Allergen Reactions  . Azithromycin Nausea And Vomiting  . Sulfa Antibiotics Swelling and Rash        Observations/Objective: VITALS: Per patient if applicable, see vitals. GENERAL: Alert, appears well and in no acute distress. HEENT: Atraumatic, conjunctiva clear, no obvious abnormalities on inspection of external nose and ears. NECK: Normal movements of the  head and neck. CARDIOPULMONARY: No increased WOB. Speaking in clear sentences. I:E ratio WNL.  MS: Moves all visible extremities without noticeable abnormality. PSYCH: Pleasant and cooperative, well-groomed. Speech normal rate and rhythm. Affect is appropriate. Insight and judgement are appropriate. Attention is focused, linear, and appropriate.  NEURO: CN grossly intact. Oriented as arrived to appointment on time with no prompting. Moves both UE equally.  SKIN: No obvious lesions, wounds, erythema, or cyanosis noted on face or hands.    Assessment and Plan:  Her hx, symptoms consistent with sinusitis.   Prescribed Augmentin Continue to use Flonase and Sudafed for symptom relief   Follow Up Instructions:  Follow up with PCP if symptoms persists.  Return and ER precautions given.     I discussed the assessment and treatment plan with the patient. The patient was provided an opportunity to ask questions and all were answered. The patient agreed with the plan and demonstrated an understanding of the instructions.   The patient was advised to call back or seek an in-person evaluation if the symptoms worsen or if the condition fails to improve as anticipated.  I provided 15 minutes of non-face-to-face time during this encounter.    Durward Parcel, FNP  06/13/2019 1042 AM     Durward Parcel, FNP 06/13/19 1050

## 2019-06-13 NOTE — Discharge Instructions (Signed)
Rest and push fluids Continue  use of Flonase and Sudafed Augmentin prescribed.  Take as directed and to completion Continue with OTC ibuprofen/tylenol as needed for pain Follow up with PCP or [facility] if symptoms persists Return or go to the ED if you have any new or worsening symptoms such as fever, chills, worsening sinus pain/pressure, cough, sore throat, chest pain, shortness of breath, abdominal pain, changes in bowel or bladder habits, etc..Marland Kitchen

## 2019-07-10 ENCOUNTER — Other Ambulatory Visit: Payer: Self-pay | Admitting: Physician Assistant

## 2019-07-10 MED ORDER — LEVOTHYROXINE SODIUM 25 MCG PO TABS: 25.0000 ug | ORAL_TABLET | Freq: Every day | ORAL | 2 refills | Status: DC

## 2019-08-15 ENCOUNTER — Encounter: Payer: Self-pay | Admitting: Physician Assistant

## 2019-10-01 ENCOUNTER — Encounter: Payer: Self-pay | Admitting: Physician Assistant

## 2019-10-01 ENCOUNTER — Ambulatory Visit: Payer: BC Managed Care – PPO | Admitting: Physician Assistant

## 2019-10-01 ENCOUNTER — Other Ambulatory Visit: Payer: Self-pay

## 2019-10-01 VITALS — BP 124/76 | HR 76 | Temp 97.1°F | Wt 156.0 lb

## 2019-10-01 DIAGNOSIS — L0291 Cutaneous abscess, unspecified: Secondary | ICD-10-CM

## 2019-10-01 MED ORDER — DOXYCYCLINE HYCLATE 100 MG PO TABS: 100.0000 mg | ORAL_TABLET | Freq: Two times a day (BID) | ORAL | 0 refills | Status: AC

## 2019-10-01 NOTE — Patient Instructions (Signed)

## 2019-10-01 NOTE — Progress Notes (Signed)
     Established patient visit   Patient: Anna Richmond   DOB: Feb 08, 1976   44 y.o. Female  MRN: [id number] Visit Date: 10/01/2019  Today's healthcare provider: Trey Sailors, PA-C   Chief Complaint  Patient presents with  . Skin Problem  I,Loyde Orth M Annell Canty,acting as a scribe for Trey Sailors, PA-C.,have documented all relevant documentation on the behalf of Trey Sailors, PA-C,as directed by  Trey Sailors, PA-C while in the presence of Trey Sailors, PA-C.  Subjective    HPI  Skin Problem Patient presents today for a possible abscess in her groin area she noticed10 days ago. Patient reports the area is dark/purple in color, drainage, swollen. She states she covered the area neosporin and Band-Aid. She also used hot compress. She reports it has been draining on a constant basis for 10 days.        Medications: Outpatient Medications Prior to Visit  Medication Sig  . levothyroxine (SYNTHROID) 25 MCG tablet Take 1 tablet (25 mcg total) by mouth daily before breakfast.  . [DISCONTINUED] amoxicillin-clavulanate (AUGMENTIN) 875-125 MG tablet Take 1 tablet by mouth every 12 (twelve) hours. (Patient not taking: Reported on 10/01/2019)   No facility-administered medications prior to visit.    Review of Systems     Objective    BP 124/76 (BP Location: Left Arm, Patient Position: Sitting, Cuff Size: Normal)   Pulse 76   Temp (!) 97.1 F (36.2 C) (Temporal)   Wt 156 lb (70.8 kg)   LMP 09/30/2019 (Exact Date)   SpO2 96%   BMI 26.78 kg/m     Physical Exam Constitutional:      Appearance: Normal appearance.  Genitourinary:   Skin:    General: Skin is warm and dry.  Neurological:     Mental Status: She is alert and oriented to person, place, and time. Mental status is at baseline.  Psychiatric:        Mood and Affect: Mood normal.        Behavior: Behavior normal.       No results found for any visits on 10/01/19.  Assessment & Plan      1. Abscess  Trial of abx as below. Counseled if this is not improving at the end of the course we should refer her to general surgery for I&D 2/2 location. - doxycycline (VIBRA-TABS) 100 MG tablet; Take 1 tablet (100 mg total) by mouth 2 (two) times daily for 7 days.  Dispense: 14 tablet; Refill: 0    Return if symptoms worsen or fail to improve.      ITrey Sailors, PA-C, have reviewed all documentation for this visit. The documentation on 10/01/19 for the exam, diagnosis, procedures, and orders are all accurate and complete.    Maryella Shivers  [facility] [phone] (phone) [phone] (fax)  [facility]

## 2019-10-23 ENCOUNTER — Encounter: Payer: Self-pay | Admitting: Physician Assistant

## 2019-10-23 DIAGNOSIS — L02214 Cutaneous abscess of groin: Secondary | ICD-10-CM

## 2019-10-29 ENCOUNTER — Other Ambulatory Visit: Payer: Self-pay

## 2019-10-29 ENCOUNTER — Ambulatory Visit: Payer: BC Managed Care – PPO | Admitting: Surgery

## 2019-10-29 ENCOUNTER — Other Ambulatory Visit: Payer: Self-pay | Admitting: Emergency Medicine

## 2019-10-29 ENCOUNTER — Encounter: Payer: Self-pay | Admitting: Surgery

## 2019-10-29 ENCOUNTER — Telehealth: Payer: Self-pay | Admitting: Emergency Medicine

## 2019-10-29 VITALS — BP 114/68 | HR 80 | Temp 97.7°F | Resp 12 | Ht 64.0 in | Wt 153.0 lb

## 2019-10-29 DIAGNOSIS — L02419 Cutaneous abscess of limb, unspecified: Secondary | ICD-10-CM

## 2019-10-29 DIAGNOSIS — L03119 Cellulitis of unspecified part of limb: Secondary | ICD-10-CM

## 2019-10-29 NOTE — Telephone Encounter (Signed)
Called pt with no answer left vm to call back to the office.    She needs to made of the following when calls back: Since there is a chance of pregnancy, urine preg test ordered to confirm. Order is placed in Epic. Pt is to go to the lab at the [facility] to get this done. No appt required.

## 2019-10-29 NOTE — Patient Instructions (Addendum)
Your CT of the Pelvis is scheduled for 11/02/19 at 3:30pm at the [employer]. Arrival 3pm. Nothing to eat or drink 4 hours prior.    You will need to pick up your prep kit before your CT appointment. You can pick this up at the [facility] or [facility].  Please see your appointment below. Call the office if you have any questions or concerns.   Skin Abscess  A skin abscess is an infected area on or under your skin that contains a collection of pus and other material. An abscess may also be called a furuncle, carbuncle, or boil. An abscess can occur in or on almost any part of your body. Some abscesses break open (rupture) on their own. Most continue to get worse unless they are treated. The infection can spread deeper into the body and eventually into your blood, which can make you feel ill. Treatment usually involves draining the abscess. What are the causes? An abscess occurs when germs, like bacteria, pass through your skin and cause an infection. This may be caused by:  A scrape or cut on your skin.  A puncture wound through your skin, including a needle injection or insect bite.  Blocked oil or sweat glands.  Blocked and infected hair follicles.  A cyst that forms beneath your skin (sebaceous cyst) and becomes infected. What increases the risk? This condition is more likely to develop in people who:  Have a weak body defense system (immune system).  Have diabetes.  Have dry and irritated skin.  Get frequent injections or use illegal IV drugs.  Have a foreign body in a wound, such as a splinter.  Have problems with their lymph system or veins. What are the signs or symptoms? Symptoms of this condition include:  A painful, firm bump under the skin.  A bump with pus at the top. This may break through the skin and drain. Other symptoms include:  Redness surrounding the abscess site.  Warmth.  Swelling of the lymph nodes  (glands) near the abscess.  Tenderness.  A sore on the skin. How is this diagnosed? This condition may be diagnosed based on:  A physical exam.  Your medical history.  A sample of pus. This may be used to find out what is causing the infection.  Blood tests.  Imaging tests, such as an ultrasound, CT scan, or MRI. How is this treated? A small abscess that drains on its own may not need treatment. Treatment for larger abscesses may include:  Moist heat or heat pack applied to the area several times a day.  A procedure to drain the abscess (incision and drainage).  Antibiotic medicines. For a severe abscess, you may first get antibiotics through an IV and then change to antibiotics by mouth. Follow these instructions at home: Medicines   Take over-the-counter and prescription medicines only as told by your health care provider.  If you were prescribed an antibiotic medicine, take it as told by your health care provider. Do not stop taking the antibiotic even if you start to feel better. Abscess care   If you have an abscess that has not drained, apply heat to the affected area. Use the heat source that your health care provider recommends, such as a moist heat pack or a heating pad. ? Place a towel between your skin and the heat source. ? Leave the heat on for 20-30 minutes. ? Remove the heat if your skin turns bright red.  This is especially important if you are unable to feel pain, heat, or cold. You may have a greater risk of getting burned.  Follow instructions from your health care provider about how to take care of your abscess. Make sure you: ? Cover the abscess with a bandage (dressing). ? Change your dressing or gauze as told by your health care provider. ? Wash your hands with soap and water before you change the dressing or gauze. If soap and water are not available, use hand sanitizer.  Check your abscess every day for signs of a worsening infection. Check  for: ? More redness, swelling, or pain. ? More fluid or blood. ? Warmth. ? More pus or a bad smell. General instructions  To avoid spreading the infection: ? Do not share personal care items, towels, or hot tubs with others. ? Avoid making skin contact with other people.  Keep all follow-up visits as told by your health care provider. This is important. Contact a health care provider if you have:  More redness, swelling, or pain around your abscess.  More fluid or blood coming from your abscess.  Warm skin around your abscess.  More pus or a bad smell coming from your abscess.  A fever.  Muscle aches.  Chills or a general ill feeling. Get help right away if you:  Have severe pain.  See red streaks on your skin spreading away from the abscess. Summary  A skin abscess is an infected area on or under your skin that contains a collection of pus and other material.  A small abscess that drains on its own may not need treatment.  Treatment for larger abscesses may include having a procedure to drain the abscess and taking an antibiotic. This information is not intended to replace advice given to you by your health care provider. Make sure you discuss any questions you have with your health care provider. Document Revised: 08/31/2018 Document Reviewed: 06/23/2017 Elsevier Patient Education  2020 [employer].

## 2019-10-29 NOTE — Telephone Encounter (Signed)
Pt called back and spoke to [facility] and was made aware of details. Pt voiced understanding.

## 2019-10-29 NOTE — Progress Notes (Signed)
Patient ID: Anna Richmond, female   DOB: [birth year], 44 y.o.   MRN: [id number]  HPI Anna Richmond is a 44 y.o. female seen in consultation at the request of Mrs. 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  She reports that she has been having symptoms for the last 6 weeks or so.  The pain is intermittent mild to moderate intensity and worsening when she exercise.  Couple weeks ago she noticed drainage after she was hiking.  No fevers no chills.  She was given doxycycline for 7 days with improvement of symptoms however she still feels the pain. Denies any history of hidradenitis.  There is no evidence of any other lesions. She is otherwise healthy and is able to perform more than 4 METS of activity without shortness of breath or chest pain.  She does have hypothyroidism.  There are no imaging studies currently available.  CBC and CMP were normal.  HPI  Past Medical History:  Diagnosis Date  . Heart murmur   . Thyroid disease     History reviewed. No pertinent surgical history.  Family History  Problem Relation Age of Onset  . Cancer Mother   . Thyroid disease Mother   . Hyperlipidemia Father   . Hypertension Maternal Grandmother   . Hypertension Paternal Grandmother   . Stroke Paternal Grandmother   . Diabetes Paternal Grandfather   . Dementia Paternal Grandfather     Social History Social History   Tobacco Use  . Smoking status: Never Smoker  . Smokeless tobacco: Never Used  Substance Use Topics  . Alcohol use: Yes  . Drug use: Never    Allergies  Allergen Reactions  . Azithromycin Nausea And Vomiting  . Sulfa Antibiotics Swelling and Rash    Current Outpatient Medications  Medication Sig Dispense Refill  . levothyroxine (SYNTHROID) 25 MCG tablet Take 1 tablet (25 mcg total) by mouth daily before breakfast. 90 tablet 2   No current facility-administered medications for this visit.     Review of  Systems Full ROS  was asked and was negative except for the information on the HPI  Physical Exam Blood pressure 114/68, pulse 80, temperature 97.7 F (36.5 C), resp. rate 12, height 5\' 4"  (1.626 m), weight 153 lb (69.4 kg), last menstrual period 09/30/2019, SpO2 97 %. CONSTITUTIONAL: NAD EYES: Pupils are equal, round, , Sclera are non-icteric. EARS, NOSE, MOUTH AND THROAT: .  She is wearing a mask hearing is intact to voice. LYMPH NODES:  Lymph nodes in the neck are normal. RESPIRATORY:  Lungs are clear. There is normal respiratory effort, with equal breath sounds bilaterally, and without pathologic use of accessory muscles. CARDIOVASCULAR: Heart is regular without murmurs, gallops, or rubs. GI: The abdomen is  soft, nontender, and nondistended. There are no palpable masses. There is no hepatosplenomegaly. There are normal bowel sounds in all quadrants. GU: Rectal deferred.   MUSCULOSKELETAL: Normal muscle strength and tone. No cyanosis or edema.   SKIN: Evidence of an indurated area lateral to the labia majora left side and within the inguinal fold.  There is some thickening and mild tenderness but there is no cellulitis there is no fluctuance there is no evidence of necrotizing infection. NEUROLOGIC: Motor and sensation is grossly normal. Cranial nerves are grossly intact. PSYCH:  Oriented to person, place and time. Affect is normal.  Data Reviewed  I have personally reviewed the patient's imaging, laboratory findings and medical records.    Assessment/Plan  44 year old female with soft tissue infection of the left groin/left labia.  Currently there is no evidence of abscess clinically.  Differentials will include cellulitis with granuloma versus potential abscess.  Given the clinically there is no evidence of abscess but she does have chronic symptoms I do feel it is prudent to further investigate the area with a CT scan of the pelvis to make sure were not missing any potential  pockets. Discussed with her about antibiotic management and currently she wants to hold off given that she has been on them for a while. A copy of this report was sent to the referring provider  Caroleen Hamman, MD FACS General Surgeon 10/29/2019, 9:42 AM

## 2019-11-02 ENCOUNTER — Other Ambulatory Visit: Payer: Self-pay

## 2019-11-02 ENCOUNTER — Ambulatory Visit
Admission: RE | Admit: 2019-11-02 | Discharge: 2019-11-02 | Disposition: A | Discharge status: Home / Self Care | Payer: BC Managed Care – PPO | Source: Ambulatory Visit | Attending: Surgery | Admitting: Surgery

## 2019-11-02 DIAGNOSIS — L02419 Cutaneous abscess of limb, unspecified: Secondary | ICD-10-CM | POA: Diagnosis present

## 2019-11-02 DIAGNOSIS — L03119 Cellulitis of unspecified part of limb: Secondary | ICD-10-CM

## 2019-11-02 MED ORDER — IOHEXOL 300 MG/ML  SOLN
100.0000 mL | Freq: Once | INTRAMUSCULAR | Status: AC | PRN
  Administered 2019-11-02: 100 mL via INTRAVENOUS

## 2019-11-05 ENCOUNTER — Telehealth: Payer: Self-pay

## 2019-11-05 NOTE — Telephone Encounter (Signed)
-----   Message from Leafy Ro, MD sent at 11/05/2019  9:59 AM EDT ----- Please let her know that her abscess has resolved ----- Message ----- From: Interface, Rad Results In Sent: 11/03/2019   9:02 PM EDT To: Leafy Ro, MD

## 2019-11-05 NOTE — Telephone Encounter (Signed)
Notified patient as instructed, patient pleased. Discussed follow-up appointments, patient agrees  

## 2019-11-07 ENCOUNTER — Other Ambulatory Visit: Payer: Self-pay

## 2019-11-07 ENCOUNTER — Ambulatory Visit: Payer: BC Managed Care – PPO | Admitting: Physician Assistant

## 2019-11-07 ENCOUNTER — Encounter: Payer: Self-pay | Admitting: Surgery

## 2019-11-07 VITALS — BP 100/53 | HR 61 | Temp 98.2°F | Resp 12 | Ht 64.0 in | Wt 152.0 lb

## 2019-11-07 DIAGNOSIS — L03119 Cellulitis of unspecified part of limb: Secondary | ICD-10-CM | POA: Diagnosis not present

## 2019-11-07 DIAGNOSIS — L02419 Cutaneous abscess of limb, unspecified: Secondary | ICD-10-CM

## 2019-11-07 NOTE — Progress Notes (Signed)
[facility] NOTE  11/07/2019  History of Present Illness: Anna Richmond is a 44 y.o. female with a history of left groin/labial abscess who presents today for follow up.   She was last seen in our clinic on 06/07 by Dr Dahlia Byes for the above issue. At that time had been doing well. She still was having pain despite completing ABx (doxycycline), and she was referred for CT Pelvis to ensure improvement/resolution.   Today, she reports that she is doing much better. No issues with pain, fever, chills, nausea, emesis. Abscess has completely healed. No erythema or drainage. CT Pelvis personally reviewed without evidence of abscess recurrence and was otherwise benign. No other complaints.    Past Medical History: Past Medical History:  Diagnosis Date  . Heart murmur   . Thyroid disease      Past Surgical History: History reviewed. No pertinent surgical history.  Home Medications: Prior to Admission medications   Medication Sig Start Date End Date Taking? Authorizing Provider  levothyroxine (SYNTHROID) 25 MCG tablet Take 1 tablet (25 mcg total) by mouth daily before breakfast. 07/10/19 10/08/19  Trinna Post, PA-C    Allergies: Allergies  Allergen Reactions  . Azithromycin Nausea And Vomiting  . Sulfa Antibiotics Swelling and Rash    Review of Systems: Review of Systems  Constitutional: Negative for chills and fever.  Gastrointestinal: Negative for abdominal pain, nausea and vomiting.  Genitourinary: Negative for dysuria and urgency.  Skin:       + Left Groin Abscess (Resolved)   All other systems reviewed and are negative.   Physical Exam BP (!) 100/53   Pulse 61   Temp 98.2 F (36.8 C) (Oral)   Resp 12   Ht 5\' 4"  (1.626 m)   Wt 152 lb (68.9 kg)   LMP 10/31/2019   SpO2 96%   BMI 26.09 kg/m   Physical Exam Vitals and nursing note reviewed. Exam conducted with a chaperone present.  Constitutional:      General: She is  not in acute distress.    Appearance: Normal appearance. She is obese. She is not ill-appearing.  HENT:     Head: Normocephalic and atraumatic.  Eyes:     Conjunctiva/sclera: Conjunctivae normal.     Pupils: Pupils are equal, round, and reactive to light.  Cardiovascular:     Rate and Rhythm: Normal rate and regular rhythm.     Pulses: Normal pulses.     Heart sounds: No murmur heard.   Pulmonary:     Effort: Pulmonary effort is normal. No respiratory distress.  Genitourinary:   Skin:    General: Skin is warm and dry.     Coloration: Skin is not pale.     Findings: No erythema.  Neurological:     General: No focal deficit present.     Mental Status: She is alert and oriented to person, place, and time.  Psychiatric:        Mood and Affect: Mood normal.        Behavior: Behavior normal.     Labs/Imaging:  CT Pelvis (11/02/2019) personally reviewed without evidence of recurrent left groin abscess and is otherwise unremarkable, and radiologist report reviewed:  IMPRESSION: Unremarkable study.  No evidence of left groin abscess.   Assessment and Plan: This is a 44 y.o. female now resolved left groin abscess    - No further need for ABx  - No intervention indicated  - rtc prn  Face-to-face time spent with  the patient and care providers was 15 minutes, with more than 50% of the time spent counseling, educating, and coordinating care of the patient.     Lynden Oxford, PA-C [facility] 11/07/2019, 11:30 AM [phone] M-F: 7am - 4pm

## 2019-11-07 NOTE — Patient Instructions (Signed)
Lease call our office with any questions or concerns.

## 2019-12-03 ENCOUNTER — Other Ambulatory Visit: Payer: Self-pay

## 2019-12-03 ENCOUNTER — Ambulatory Visit (INDEPENDENT_AMBULATORY_CARE_PROVIDER_SITE_OTHER): Payer: BC Managed Care – PPO | Admitting: Physician Assistant

## 2019-12-03 ENCOUNTER — Encounter: Payer: Self-pay | Admitting: Physician Assistant

## 2019-12-03 VITALS — BP 102/68 | HR 68 | Temp 97.1°F | Ht 64.0 in | Wt 153.0 lb

## 2019-12-03 DIAGNOSIS — R232 Flushing: Secondary | ICD-10-CM | POA: Diagnosis not present

## 2019-12-03 DIAGNOSIS — E039 Hypothyroidism, unspecified: Secondary | ICD-10-CM

## 2019-12-03 DIAGNOSIS — N926 Irregular menstruation, unspecified: Secondary | ICD-10-CM

## 2019-12-03 DIAGNOSIS — Z0000 Encounter for general adult medical examination without abnormal findings: Secondary | ICD-10-CM | POA: Diagnosis not present

## 2019-12-03 DIAGNOSIS — Z1159 Encounter for screening for other viral diseases: Secondary | ICD-10-CM

## 2019-12-03 NOTE — Progress Notes (Signed)
Complete physical exam   Patient: Anna Richmond   DOB: 07-13-1975   44 y.o. Female  MRN: [id number] Visit Date: 12/03/2019  Today's healthcare provider: Trey SailorsAdriana M Aydden Cumpian, PA-C   Chief Complaint  Patient presents with  . Annual Exam   Subjective    Anna Richmond is a 44 y.o. female who presents today for a complete physical exam.  She reports consuming a general diet. Home exercise routine includes walking 1 hrs per day. She generally feels fairly well. She reports sleeping fairly well. She does have additional problems to discuss today.    Patient presents for irregular menstrual cycles. She reports she has not had a menstrual cycle in > 6 weeks. Denis sexual activity in that time frame. Reports hot flashes and night sweats. She stopped taking her synthroid due to this. She hasn't taken synthroid in about 3 weeks. She reports she thinks her mother went through menopause in her mid 5240s.   Hypothyroid, follow-up  Lab Results  Component Value Date   TSH 4.340 12/03/2019   TSH 3.910 03/26/2019   TSH 2.700 12/13/2018   FREET4 0.91 11/10/2018   Wt Readings from Last 3 Encounters:  12/03/19 153 lb (69.4 kg)  11/07/19 152 lb (68.9 kg)  10/29/19 153 lb (69.4 kg)    She was last seen for hypothyroid 6 months ago.  Management since that visit includes synthroid 25 mcg. She reports poor compliance with treatment. She is having side effects. Reports she had hot flashes and night sweats so she stopped taking her synthroid.   Symptoms: No change in energy level No constipation  No diarrhea Yes heat / cold intolerance  No nervousness No palpitations  No weight changes    -----------------------------------------------------------------------------------------    Past Medical History:  Diagnosis Date  . Heart murmur   . Thyroid disease    No past surgical history on file. Social History   Socioeconomic History  . Marital status: Married     Spouse name: Not on file  . Number of children: Not on file  . Years of education: Not on file  . Highest education level: Not on file  Occupational History  . Not on file  Tobacco Use  . Smoking status: Never Smoker  . Smokeless tobacco: Never Used  Vaping Use  . Vaping Use: Never used  Substance and Sexual Activity  . Alcohol use: Yes  . Drug use: Never  . Sexual activity: Yes    Birth control/protection: None  Other Topics Concern  . Not on file  Social History Narrative  . Not on file   Social Determinants of Health   Financial Resource Strain:   . Difficulty of Paying Living Expenses:   Food Insecurity:   . Worried About Programme researcher, broadcasting/film/videounning Out of Food in the Last Year:   . Baristaan Out of Food in the Last Year:   Transportation Needs:   . Freight forwarderLack of Transportation (Medical):   Marland Kitchen. Lack of Transportation (Non-Medical):   Physical Activity:   . Days of Exercise per Week:   . Minutes of Exercise per Session:   Stress:   . Feeling of Stress :   Social Connections:   . Frequency of Communication with Friends and Family:   . Frequency of Social Gatherings with Friends and Family:   . Attends Religious Services:   . Active Member of Clubs or Organizations:   . Attends BankerClub or Organization Meetings:   Marland Kitchen. Marital Status:   Intimate Programme researcher, broadcasting/film/videoartner  Violence:   . Fear of Current or Ex-Partner:   . Emotionally Abused:   Marland Kitchen Physically Abused:   . Sexually Abused:    Family Status  Relation Name Status  . Mother  Deceased at age 97       cancer lung  . Father  Alive  . MGM  Deceased  . PGM  Deceased  . PGF  Deceased  . Brother  Alive  . Brother  Alive   Family History  Problem Relation Age of Onset  . Cancer Mother   . Thyroid disease Mother   . Hyperlipidemia Father   . Hypertension Maternal Grandmother   . Hypertension Paternal Grandmother   . Stroke Paternal Grandmother   . Diabetes Paternal Grandfather   . Dementia Paternal Grandfather    Allergies  Allergen Reactions  .  Azithromycin Nausea And Vomiting  . Sulfa Antibiotics Swelling and Rash    Patient Care Team: Maryella Shivers as PCP - General (Physician Assistant)   Medications: Outpatient Medications Prior to Visit  Medication Sig  . levothyroxine (SYNTHROID) 25 MCG tablet Take 1 tablet (25 mcg total) by mouth daily before breakfast.   No facility-administered medications prior to visit.    Review of Systems  Constitutional: Negative.   HENT: Negative.   Eyes: Negative.   Respiratory: Negative.   Cardiovascular: Negative.   Gastrointestinal: Negative.   Endocrine: Negative.   Genitourinary: Negative.   Musculoskeletal: Negative.   Skin: Negative.   Allergic/Immunologic: Negative.   Neurological: Negative.   Hematological: Negative.   Psychiatric/Behavioral: Negative.       Objective    BP 102/68   Pulse 68   Temp (!) 97.1 F (36.2 C)   Ht 5\' 4"  (1.626 m)   Wt 153 lb (69.4 kg)   BMI 26.26 kg/m    Physical Exam Constitutional:      Appearance: Normal appearance.  HENT:     Right Ear: Tympanic membrane normal.     Left Ear: Tympanic membrane normal.  Cardiovascular:     Rate and Rhythm: Normal rate and regular rhythm.     Pulses: Normal pulses.     Heart sounds: Normal heart sounds.  Pulmonary:     Effort: Pulmonary effort is normal.     Breath sounds: Normal breath sounds.  Abdominal:     General: Bowel sounds are normal.  Musculoskeletal:     Cervical back: Normal range of motion and neck supple.  Skin:    General: Skin is warm and dry.  Neurological:     Mental Status: She is alert and oriented to person, place, and time. Mental status is at baseline.  Psychiatric:        Mood and Affect: Mood normal.        Behavior: Behavior normal.      Last depression screening scores PHQ 2/9 Scores 12/03/2019 11/10/2018  PHQ - 2 Score 1 1  PHQ- 9 Score - 3   Last fall risk screening Fall Risk  11/07/2019  Falls in the past year? 0  Number falls in past yr: -   Injury with Fall? -  Follow up -   Last Audit-C alcohol use screening Alcohol Use Disorder Test (AUDIT) 12/03/2019  1. How often do you have a drink containing alcohol? 2  2. How many drinks containing alcohol do you have on a typical day when you are drinking? 0  3. How often do you have six or more drinks on one occasion? 0  AUDIT-C Score 2  Alcohol Brief Interventions/Follow-up AUDIT Score <7 follow-up not indicated   A score of 3 or more in women, and 4 or more in men indicates increased risk for alcohol abuse, EXCEPT if all of the points are from question 1   Results for orders placed or performed in visit on 12/03/19  FSH/LH  Result Value Ref Range   LH 52.9 mIU/mL   FSH 87.4 mIU/mL  Comprehensive metabolic panel  Result Value Ref Range   Glucose 78 65 - 99 mg/dL   BUN 13 6 - 24 mg/dL   Creatinine, Ser 4.16 0.57 - 1.00 mg/dL   GFR calc non Af Amer 77 >59 mL/min/1.73   GFR calc Af Amer 89 >59 mL/min/1.73   BUN/Creatinine Ratio 14 9 - 23   Sodium 139 134 - 144 mmol/L   Potassium 4.1 3.5 - 5.2 mmol/L   Chloride 102 96 - 106 mmol/L   CO2 22 20 - 29 mmol/L   Calcium 9.5 8.7 - 10.2 mg/dL   Total Protein 7.5 6.0 - 8.5 g/dL   Albumin 4.7 3.8 - 4.8 g/dL   Globulin, Total 2.8 1.5 - 4.5 g/dL   Albumin/Globulin Ratio 1.7 1.2 - 2.2   Bilirubin Total 0.3 0.0 - 1.2 mg/dL   Alkaline Phosphatase 68 48 - 121 IU/L   AST 18 0 - 40 IU/L   ALT 18 0 - 32 IU/L  CBC with Differential/Platelet  Result Value Ref Range   WBC 6.4 3.4 - 10.8 x10E3/uL   RBC 4.45 3.77 - 5.28 x10E6/uL   Hemoglobin 14.0 11.1 - 15.9 g/dL   Hematocrit 38.4 53.6 - 46.6 %   MCV 91 79 - 97 fL   MCH 31.5 26.6 - 33.0 pg   MCHC 34.6 31 - 35 g/dL   RDW 46.8 03.2 - 12.2 %   Platelets 275 150 - 450 x10E3/uL   Neutrophils 47 Not Estab. %   Lymphs 40 Not Estab. %   Monocytes 8 Not Estab. %   Eos 4 Not Estab. %   Basos 1 Not Estab. %   Neutrophils Absolute 3.0 1 - 7 x10E3/uL   Lymphocytes Absolute 2.6 0 - 3 x10E3/uL     Monocytes Absolute 0.5 0 - 0 x10E3/uL   EOS (ABSOLUTE) 0.2 0.0 - 0.4 x10E3/uL   Basophils Absolute 0.1 0 - 0 x10E3/uL   Immature Granulocytes 0 Not Estab. %   Immature Grans (Abs) 0.0 0.0 - 0.1 x10E3/uL  TSH  Result Value Ref Range   TSH 4.340 0.450 - 4.500 uIU/mL  Lipid panel  Result Value Ref Range   Cholesterol, Total 208 (H) 100 - 199 mg/dL   Triglycerides 482 (H) 0 - 149 mg/dL   HDL 44 >50 mg/dL   VLDL Cholesterol Cal 29 5 - 40 mg/dL   LDL Chol Calc (NIH) 037 (H) 0 - 99 mg/dL   Chol/HDL Ratio 4.7 (H) 0.0 - 4.4 ratio  Beta HCG, Quant  Result Value Ref Range   hCG Quant 1 mIU/mL  Hepatitis C antibody  Result Value Ref Range   Hep C Virus Ab <0.1 0.0 - 0.9 s/co ratio    Assessment & Plan    1. Annual physical exam  UTD on screenings.  2. Irregular periods  HCG negative, FSH:LH ratio > 1 and in menopausal range. Symptoms consistent with early menopause, she may have genetic predisposition to this. Would recommend eval by OBGYN.   3. Hot flashes  She stopped taking her synthroid.  Will observe for now as TSH is in normal range.   4. Hypothyroidism, unspecified type   5. Need for hepatitis C screening test   Routine Health Maintenance and Physical Exam  Exercise Activities and Dietary recommendations Goals   None     Immunization History  Administered Date(s) Administered  . Influenza,inj,Quad PF,6+ Mos 03/30/2019  . Influenza-Unspecified 04/22/2018  . Tdap 04/22/2018    Health Maintenance  Topic Date Due  . COVID-19 Vaccine (1) Never done  . INFLUENZA VACCINE  12/23/2019  . PAP SMEAR-Modifier  11/10/2023  . TETANUS/TDAP  04/22/2028  . Hepatitis C Screening  Completed  . HIV Screening  Completed    Discussed health benefits of physical activity, and encouraged her to engage in regular exercise appropriate for her age and condition.   Return in about 6 months (around 06/04/2020) for hypothyroidism.     ITrey Sailors, PA-C, have reviewed  all documentation for this visit. The documentation on 12/14/19 for the exam, diagnosis, procedures, and orders are all accurate and complete.    Maryella Shivers  [facility] [phone] (phone) [phone] (fax)  [facility]

## 2019-12-04 LAB — CBC WITH DIFFERENTIAL/PLATELET
Basophils Absolute: 0.1 10*3/uL (ref 0.0–0.2)
Basos: 1 %
EOS (ABSOLUTE): 0.2 10*3/uL (ref 0.0–0.4)
Eos: 4 %
Hematocrit: 40.5 % (ref 34.0–46.6)
Hemoglobin: 14 g/dL (ref 11.1–15.9)
Immature Grans (Abs): 0 10*3/uL (ref 0.0–0.1)
Immature Granulocytes: 0 %
Lymphocytes Absolute: 2.6 10*3/uL (ref 0.7–3.1)
Lymphs: 40 %
MCH: 31.5 pg (ref 26.6–33.0)
MCHC: 34.6 g/dL (ref 31.5–35.7)
MCV: 91 fL (ref 79–97)
Monocytes Absolute: 0.5 10*3/uL (ref 0.1–0.9)
Monocytes: 8 %
Neutrophils Absolute: 3 10*3/uL (ref 1.4–7.0)
Neutrophils: 47 %
Platelets: 275 10*3/uL (ref 150–450)
RBC: 4.45 x10E6/uL (ref 3.77–5.28)
RDW: 12.1 % (ref 11.7–15.4)
WBC: 6.4 10*3/uL (ref 3.4–10.8)

## 2019-12-04 LAB — COMPREHENSIVE METABOLIC PANEL
ALT: 18 IU/L (ref 0–32)
AST: 18 IU/L (ref 0–40)
Albumin/Globulin Ratio: 1.7 (ref 1.2–2.2)
Albumin: 4.7 g/dL (ref 3.8–4.8)
Alkaline Phosphatase: 68 IU/L (ref 48–121)
BUN/Creatinine Ratio: 14 (ref 9–23)
BUN: 13 mg/dL (ref 6–24)
Bilirubin Total: 0.3 mg/dL (ref 0.0–1.2)
CO2: 22 mmol/L (ref 20–29)
Calcium: 9.5 mg/dL (ref 8.7–10.2)
Chloride: 102 mmol/L (ref 96–106)
Creatinine, Ser: 0.92 mg/dL (ref 0.57–1.00)
GFR calc Af Amer: 89 mL/min/{1.73_m2} (ref >59)
GFR calc non Af Amer: 77 mL/min/{1.73_m2} (ref >59)
Globulin, Total: 2.8 g/dL (ref 1.5–4.5)
Glucose: 78 mg/dL (ref 65–99)
Potassium: 4.1 mmol/L (ref 3.5–5.2)
Sodium: 139 mmol/L (ref 134–144)
Total Protein: 7.5 g/dL (ref 6.0–8.5)

## 2019-12-04 LAB — LIPID PANEL
Chol/HDL Ratio: 4.7 ratio — ABNORMAL HIGH (ref 0.0–4.4)
Cholesterol, Total: 208 mg/dL — ABNORMAL HIGH (ref 100–199)
HDL: 44 mg/dL (ref >39)
LDL Chol Calc (NIH): 135 mg/dL — ABNORMAL HIGH (ref 0–99)
Triglycerides: 162 mg/dL — ABNORMAL HIGH (ref 0–149)
VLDL Cholesterol Cal: 29 mg/dL (ref 5–40)

## 2019-12-04 LAB — FSH/LH
FSH: 87.4 m[IU]/mL
LH: 52.9 m[IU]/mL

## 2019-12-04 LAB — BETA HCG QUANT (REF LAB): hCG Quant: 1 m[IU]/mL

## 2019-12-04 LAB — HEPATITIS C ANTIBODY: Hep C Virus Ab: <0.1 s/co ratio (ref 0.0–0.9)

## 2019-12-04 LAB — TSH: TSH: 4.34 u[IU]/mL (ref 0.450–4.500)

## 2019-12-06 ENCOUNTER — Telehealth: Payer: Self-pay

## 2019-12-06 NOTE — Telephone Encounter (Signed)
Status: Final result       Visible to patient: Yes (seen)

## 2019-12-06 NOTE — Telephone Encounter (Signed)
-----   Message from Trey Sailors, [place] sent at 12/06/2019  9:32 AM EDT ----- Can we add on estradiol under menopausal symptoms?

## 2020-05-08 ENCOUNTER — Encounter: Payer: Self-pay | Admitting: Obstetrics and Gynecology

## 2020-05-08 ENCOUNTER — Other Ambulatory Visit: Payer: Self-pay

## 2020-05-08 ENCOUNTER — Ambulatory Visit (INDEPENDENT_AMBULATORY_CARE_PROVIDER_SITE_OTHER): Payer: BC Managed Care – PPO | Admitting: Obstetrics and Gynecology

## 2020-05-08 VITALS — BP 100/60 | Ht 64.0 in | Wt 152.0 lb

## 2020-05-08 DIAGNOSIS — R61 Generalized hyperhidrosis: Secondary | ICD-10-CM | POA: Diagnosis not present

## 2020-05-08 DIAGNOSIS — Z30011 Encounter for initial prescription of contraceptive pills: Secondary | ICD-10-CM | POA: Diagnosis not present

## 2020-05-08 DIAGNOSIS — N951 Menopausal and female climacteric states: Secondary | ICD-10-CM | POA: Diagnosis not present

## 2020-05-08 MED ORDER — MICROGESTIN 24 FE 1-20 MG-MCG PO TABS: 1.0000 | ORAL_TABLET | Freq: Every day | ORAL | 0 refills | Status: DC

## 2020-05-08 NOTE — Patient Instructions (Signed)
I value your feedback and entrusting us with your care. If you get a [facility] patient survey, I would appreciate you taking the time to let us know about your experience today. Thank you!  As of May 03, 2019, your lab results will be released to your MyChart immediately, before I even have a chance to see them. Please give me time to review them and contact you if there are any abnormalities. Thank you for your patience.  

## 2020-05-08 NOTE — Progress Notes (Signed)
Trey Sailors, PA-C   Chief Complaint  Patient presents with  . HRT    HPI:      [name] is a 44 y.o. G0P0000 whose LMP was Patient's last menstrual period was 04/22/2020 (exact date)., presents today for HRT due to night sweats. Menses are Q30-50 days, lasting 5-7 days, mod flow, usually no BTB but did have some 10/21; mild dysmen for 1 day. Had infrequent menses this summer with significant vasomotor sx and saw PCP. FSH=87 7/21 but estradiol inadvertently not done; hx of hypothyroidism, euthyroid on 7/21 labs. Sx resolved but have since recurred in the form of drenching night sweats the past 10 days, affecting pt's sleep. Pt is taking thyroid medication. Mom with earlier menopause and did HRT for many yr, but is deceased so pt can't get age clarification.  Pt is sex active, not using BC except spermicide. No pain/bleeding. Is not interested in conception. No hx of HTN, DVTs, migraines with aura. Did OCPs in past without problems.  Last pap 6/20/annual with PCP 7/21. Normal CBC and CMP 7/21  Past Medical History:  Diagnosis Date  . Heart murmur   . Thyroid disease     History reviewed. No pertinent surgical history.  Family History  Problem Relation Age of Onset  . Cancer Mother   . Thyroid disease Mother   . Hyperlipidemia Father   . Hypertension Maternal Grandmother   . Hypertension Paternal Grandmother   . Stroke Paternal Grandmother   . Diabetes Paternal Grandfather   . Dementia Paternal Grandfather     Social History   Socioeconomic History  . Marital status: Married    Spouse name: Not on file  . Number of children: Not on file  . Years of education: Not on file  . Highest education level: Not on file  Occupational History  . Not on file  Tobacco Use  . Smoking status: Never Smoker  . Smokeless tobacco: Never Used  Vaping Use  . Vaping Use: Never used  Substance and Sexual Activity  . Alcohol use: Yes  . Drug use: Never  .  Sexual activity: Yes    Birth control/protection: None  Other Topics Concern  . Not on file  Social History Narrative  . Not on file   Social Determinants of Health   Financial Resource Strain: Not on file  Food Insecurity: Not on file  Transportation Needs: Not on file  Physical Activity: Not on file  Stress: Not on file  Social Connections: Not on file  Intimate Partner Violence: Not on file    Outpatient Medications Prior to Visit  Medication Sig Dispense Refill  . levothyroxine (SYNTHROID) 25 MCG tablet Take 1 tablet (25 mcg total) by mouth daily before breakfast. 90 tablet 2   No facility-administered medications prior to visit.      ROS:  Review of Systems  Constitutional: Positive for fatigue. Negative for fever, malaise/fatigue and weight loss.  Gastrointestinal: Negative for blood in stool, constipation, diarrhea, nausea and vomiting.  Endocrine: Positive for heat intolerance.  Genitourinary: Positive for menstrual problem. Negative for dyspareunia, dysuria, flank pain, frequency, hematuria, urgency, vaginal bleeding, vaginal discharge and vaginal pain.  Musculoskeletal: Negative for back pain.  Skin: Negative for itching and rash.  Neurological: Positive for headaches.  Psychiatric/Behavioral: Positive for sleep disturbance.   OBJECTIVE:   Vitals:  BP 100/60   Ht 5\' 4"  (1.626 m)   Wt 152 lb (68.9 kg)   LMP 04/22/2020 (Exact Date)  BMI 26.09 kg/m   Physical Exam Vitals reviewed.  Constitutional:      Appearance: She is well-developed.  Pulmonary:     Effort: Pulmonary effort is normal.  Musculoskeletal:        General: Normal range of motion.     Cervical back: Normal range of motion.  Skin:    General: Skin is warm and dry.  Neurological:     General: No focal deficit present.     Mental Status: She is alert and oriented to person, place, and time.     Cranial Nerves: No cranial nerve deficit.  Psychiatric:        Mood and Affect: Mood  normal.        Behavior: Behavior normal.        Thought Content: Thought content normal.        Judgment: Judgment normal.     Assessment/Plan: Perimenopause--pt having menses Q1-2 months so not menopausal. Discussed normal to start having vasomotor sx in 40s that can ebb and flow. Stress can also cause sx. HRT not significant enough dose to treat sx and not indicated. Discussed trial of OCPs to see if helps with sx and gives cycle control vs sleep meds to help with sleep. Pt would like to try OCPs since doesn't want conception anyway. Rx lomedia, start this Sun after neg UPT. Condoms. F/u in 3 months via phone with sx, sooner prn.   Night sweats--try OCPs. F/u prn. Can also be stress related. If persist, will check other labs.   Encounter for initial prescription of contraceptive pills - Plan: Norethindrone Acetate-Ethinyl Estrad-FE (MICROGESTIN 24 FE) 1-20 MG-MCG(24) tablet    Meds ordered this encounter  Medications  . Norethindrone Acetate-Ethinyl Estrad-FE (MICROGESTIN 24 FE) 1-20 MG-MCG(24) tablet    Sig: Take 1 tablet by mouth daily.    Dispense:  84 tablet    Refill:  0    Order Specific Question:   Supervising Provider    Answer:   Nadara Mustard [[id number]]      Return if symptoms worsen or fail to improve.  Adelaido Nicklaus B. Thorin Starner, PA-C 05/08/2020 4:30 PM

## 2020-05-19 NOTE — Progress Notes (Signed)
Established patient visit   Patient: Anna Richmond   DOB: 01/07/1976   44 y.o. Female  MRN: [id number] Visit Date: 05/20/2020  Today's healthcare provider: Trey Sailors, PA-C   Chief Complaint  Patient presents with  . Insomnia   Subjective    Insomnia Primary symptoms: sleep disturbance, difficulty falling asleep.  The current episode started 1 to 4 weeks ago (about 3 weeks ). The onset quality is gradual. The problem occurs nightly. The problem has been gradually worsening since onset.    Patient reports that she does take Tylenol PM and dramamine to help with her sleep. She reports that this has helped a little. She also mentions that she has not started on the birth control pills yet, waiting for next cycle. This was prescribed by her GYN to help with night sweats. Reports her symptoms are present mostly at night. She was seen previously in 11/2019 for perimenopausal symptoms with labwork showing perimenopause. She is concerned about potential thyroid symptoms.   Wt Readings from Last 3 Encounters:  05/20/20 156 lb (70.8 kg)  05/08/20 152 lb (68.9 kg)  12/03/19 153 lb (69.4 kg)        Medications: Outpatient Medications Prior to Visit  Medication Sig  . Norethindrone Acetate-Ethinyl Estrad-FE (MICROGESTIN 24 FE) 1-20 MG-MCG(24) tablet Take 1 tablet by mouth daily.  Marland Kitchen levothyroxine (SYNTHROID) 25 MCG tablet Take 1 tablet (25 mcg total) by mouth daily before breakfast.   No facility-administered medications prior to visit.    Review of Systems  Constitutional: Negative.   Respiratory: Negative.   Cardiovascular: Negative.   Hematological: Negative.   Psychiatric/Behavioral: Positive for sleep disturbance. The patient has insomnia.       Objective    BP 99/67   Pulse 71   Temp 98.5 F (36.9 C)   Resp 20   Ht 5\' 4"  (1.626 m)   Wt 156 lb (70.8 kg)   LMP 04/22/2020 (Exact Date)   BMI 26.78 kg/m    Physical Exam Constitutional:       Appearance: Normal appearance.  Neck:     Thyroid: No thyroid mass or thyromegaly.  Cardiovascular:     Rate and Rhythm: Normal rate and regular rhythm.     Heart sounds: Normal heart sounds.  Pulmonary:     Effort: Pulmonary effort is normal.     Breath sounds: Normal breath sounds.  Skin:    General: Skin is warm and dry.  Neurological:     Mental Status: She is alert and oriented to person, place, and time. Mental status is at baseline.  Psychiatric:        Mood and Affect: Mood normal.        Behavior: Behavior normal.       No results found for any visits on 05/20/20.  Assessment & Plan     1. Hot flashes  Suspect perimenopausal. She has not yet started OCP, is waiting for next cycle. Check thyroid as below to r/o. Hopefully insomnia symptoms will improve with OCP, if not may try trazodone as below.  - TSH  2. Insomnia, unspecified type  - traZODone (DESYREL) 50 MG tablet; Take 0.5-1 tablets (25-50 mg total) by mouth at bedtime as needed for sleep.  Dispense: 30 tablet; Refill: 3   Return if symptoms worsen or fail to improve.      I12/30/21, PA-C, have reviewed all documentation for this visit. The documentation on 05/21/20 for the exam,  diagnosis, procedures, and orders are all accurate and complete.  The entirety of the information documented in the History of Present Illness, Review of Systems and Physical Exam were personally obtained by me. Portions of this information were initially documented by Anson Oregon, CMA and reviewed by me for thoroughness and accuracy.      Maryella Shivers  [facility] [phone] (phone) [phone] (fax)  [facility]

## 2020-05-20 ENCOUNTER — Ambulatory Visit: Payer: BC Managed Care – PPO | Admitting: Physician Assistant

## 2020-05-20 ENCOUNTER — Encounter: Payer: Self-pay | Admitting: Physician Assistant

## 2020-05-20 ENCOUNTER — Other Ambulatory Visit: Payer: Self-pay

## 2020-05-20 VITALS — BP 99/67 | HR 71 | Temp 98.5°F | Resp 20 | Ht 64.0 in | Wt 156.0 lb

## 2020-05-20 DIAGNOSIS — R232 Flushing: Secondary | ICD-10-CM

## 2020-05-20 DIAGNOSIS — G4700 Insomnia, unspecified: Secondary | ICD-10-CM | POA: Diagnosis not present

## 2020-05-20 MED ORDER — TRAZODONE HCL 50 MG PO TABS: 25.0000 mg | ORAL_TABLET | Freq: Every evening | ORAL | 3 refills | Status: DC | PRN

## 2020-05-20 NOTE — Patient Instructions (Signed)

## 2020-05-21 LAB — TSH: TSH: 4 u[IU]/mL (ref 0.450–4.500)

## 2021-01-09 ENCOUNTER — Telehealth: Payer: Self-pay

## 2021-01-09 NOTE — Telephone Encounter (Signed)
Appts made with Robynn Pane

## 2021-01-09 NOTE — Telephone Encounter (Signed)
Copied from CRM [id number]. Topic: Appointment Scheduling - Scheduling Inquiry for Clinic >> Jan 09, 2021 12:52 PM Crist Infante wrote: Reason for CRM: pt would like to make appt with Robynn Pane for a cpe.  She would like appt asap

## 2021-01-16 ENCOUNTER — Other Ambulatory Visit: Payer: Self-pay

## 2021-01-16 ENCOUNTER — Ambulatory Visit: Payer: BC Managed Care – PPO | Admitting: Family Medicine

## 2021-01-16 ENCOUNTER — Encounter: Payer: Self-pay | Admitting: Family Medicine

## 2021-01-16 VITALS — BP 97/60 | HR 66 | Temp 98.4°F | Ht 64.0 in | Wt 158.7 lb

## 2021-01-16 DIAGNOSIS — R002 Palpitations: Secondary | ICD-10-CM | POA: Diagnosis not present

## 2021-01-16 DIAGNOSIS — R61 Generalized hyperhidrosis: Secondary | ICD-10-CM

## 2021-01-16 DIAGNOSIS — R232 Flushing: Secondary | ICD-10-CM | POA: Diagnosis not present

## 2021-01-16 DIAGNOSIS — E039 Hypothyroidism, unspecified: Secondary | ICD-10-CM

## 2021-01-16 MED ORDER — HYDROXYZINE PAMOATE 25 MG PO CAPS
25.0000 mg | ORAL_CAPSULE | Freq: Three times a day (TID) | ORAL | 0 refills | Status: DC | PRN
Start: 2021-01-16 — End: 2021-03-09

## 2021-01-16 NOTE — Assessment & Plan Note (Signed)
Not similar to known hot flashes May or may not sweat

## 2021-01-16 NOTE — Assessment & Plan Note (Signed)
EKG normal Discussed stressors- none noted Exercising, low caffeine, low stress, low ETOH, good sleep

## 2021-01-16 NOTE — Assessment & Plan Note (Signed)
No complaints of fatigue or weight change Repeat TSH

## 2021-01-16 NOTE — Progress Notes (Signed)
Established patient visit   Patient: Anna Richmond   DOB: 08-Mar-1976   45 y.o. Female  MRN: [id number] Visit Date: 01/16/2021  Today's healthcare provider: Jacky Kindle, FNP   Chief Complaint  Patient presents with   Follow-up   Hypothyroidism   Subjective  -------------------------------------------------------------------------------------------------------------------- HPI  Hypothyroid, follow-up  Lab Results  Component Value Date   TSH 4.000 05/20/2020   TSH 4.340 12/03/2019   TSH 3.910 03/26/2019   FREET4 0.91 11/10/2018   Wt Readings from Last 3 Encounters:  01/16/21 158 lb 11.2 oz (72 kg)  05/20/20 156 lb (70.8 kg)  05/08/20 152 lb (68.9 kg)    She was last seen for hypothyroid 13 months ago. (12/03/2019) Management since that visit includes synthroid stoppped. TSH was in normal range. Continue with monitoring She reports good compliance with treatment. She is nothaving side effects. none Symptoms: No change in energy level No constipation  Yes diarrhea Yes heat / cold intolerance  No nervousness Yes palpitations  No weight changes     Hot and sweaty a few times throughout the night and sometimes during the day more than normal. Began about a month ago would consider a heat intolerance because she cant do anything else until she as completely cooled off. Diarrhea began around the same time regardless of food intake. Notice palpitations sometimes when sitting down, and sometimes after meals.  -----------------------------------------------------------------------------------------   Patient Active Problem List   Diagnosis Date Noted   Hypothyroidism 01/16/2021   Palpitations 01/16/2021   Flushing 01/16/2021   Abnormal flushing and sweating 01/16/2021   Thyroid disease    Heart murmur    Past Medical History:  Diagnosis Date   Heart murmur    Substance abuse (HCC)    Thyroid disease    Allergies  Allergen Reactions   Azithromycin  Nausea And Vomiting   Sulfa Antibiotics Swelling and Rash       Medications: Outpatient Medications Prior to Visit  Medication Sig   [DISCONTINUED] levothyroxine (SYNTHROID) 25 MCG tablet Take 1 tablet (25 mcg total) by mouth daily before breakfast.   [DISCONTINUED] Norethindrone Acetate-Ethinyl Estrad-FE (MICROGESTIN 24 FE) 1-20 MG-MCG(24) tablet Take 1 tablet by mouth daily.   [DISCONTINUED] traZODone (DESYREL) 50 MG tablet Take 0.5-1 tablets (25-50 mg total) by mouth at bedtime as needed for sleep.   No facility-administered medications prior to visit.    Review of Systems  Last CBC Lab Results  Component Value Date   WBC 6.4 12/03/2019   HGB 14.0 12/03/2019   HCT 40.5 12/03/2019   MCV 91 12/03/2019   MCH 31.5 12/03/2019   RDW 12.1 12/03/2019   PLT 275 12/03/2019   Last metabolic panel Lab Results  Component Value Date   GLUCOSE 78 12/03/2019   NA 139 12/03/2019   K 4.1 12/03/2019   CL 102 12/03/2019   CO2 22 12/03/2019   BUN 13 12/03/2019   CREATININE 0.92 12/03/2019   GFRNONAA 77 12/03/2019   GFRAA 89 12/03/2019   CALCIUM 9.5 12/03/2019   PROT 7.5 12/03/2019   ALBUMIN 4.7 12/03/2019   LABGLOB 2.8 12/03/2019   AGRATIO 1.7 12/03/2019   BILITOT 0.3 12/03/2019   ALKPHOS 68 12/03/2019   AST 18 12/03/2019   ALT 18 12/03/2019   Last lipids Lab Results  Component Value Date   CHOL 208 (H) 12/03/2019   HDL 44 12/03/2019   LDLCALC 135 (H) 12/03/2019   TRIG 162 (H) 12/03/2019   CHOLHDL 4.7 (H) 12/03/2019  Last thyroid functions Lab Results  Component Value Date   TSH 4.000 05/20/2020     Objective  -------------------------------------------------------------------------------------------------------------------- BP 97/60 (BP Location: Right Arm, Patient Position: Sitting, Cuff Size: Large)   Pulse 66   Temp 98.4 F (36.9 C) (Oral)   Ht 5\' 4"  (1.626 m)   Wt 158 lb 11.2 oz (72 kg)   LMP 01/10/2021 (Exact Date) Comment: 5 days long  SpO2 100%   BMI  27.24 kg/m  BP Readings from Last 3 Encounters:  01/16/21 97/60  05/20/20 99/67  05/08/20 100/60   Wt Readings from Last 3 Encounters:  01/16/21 158 lb 11.2 oz (72 kg)  05/20/20 156 lb (70.8 kg)  05/08/20 152 lb (68.9 kg)       Physical Exam Vitals and nursing note reviewed.  Constitutional:      General: She is not in acute distress.    Appearance: She is not ill-appearing, toxic-appearing or diaphoretic.  HENT:     Head: Normocephalic.  Neck:     Vascular: No carotid bruit.  Cardiovascular:     Rate and Rhythm: Normal rate and regular rhythm.     Pulses: Normal pulses.     Heart sounds: Normal heart sounds. No murmur heard.   No friction rub. No gallop.  Pulmonary:     Effort: Pulmonary effort is normal. No respiratory distress.     Breath sounds: Normal breath sounds. No stridor. No wheezing, rhonchi or rales.  Abdominal:     General: Bowel sounds are normal. There is no distension.     Palpations: Abdomen is soft. There is no mass.     Tenderness: There is no abdominal tenderness. There is no guarding or rebound.     Hernia: No hernia is present.  Musculoskeletal:        General: Normal range of motion.     Cervical back: Normal range of motion and neck supple. No rigidity or tenderness.  Lymphadenopathy:     Cervical: No cervical adenopathy.  Skin:    General: Skin is warm and dry.     Capillary Refill: Capillary refill takes less than 2 seconds.     Coloration: Skin is not jaundiced or pale.     Findings: No bruising, erythema, lesion or rash.  Neurological:     General: No focal deficit present.     Mental Status: She is alert and oriented to person, place, and time.     Cranial Nerves: No cranial nerve deficit.     Sensory: No sensory deficit.     Motor: No weakness.     Coordination: Coordination normal.     Gait: Gait normal.     Deep Tendon Reflexes: Reflexes normal.  Psychiatric:        Mood and Affect: Mood normal.        Behavior: Behavior  normal.        Thought Content: Thought content normal.        Judgment: Judgment normal.      No results found for any visits on 01/16/21.  Assessment & Plan  --------------------------------------------------------------------------------------------------------------------- Problem List Items Addressed This Visit       Cardiovascular and Mediastinum   Flushing    Acute concern Variable presentation Has tried to change intake, habits, and supplements to predict Unable to find pattern Routine blood work today Discussed possible causes include low overall blood volume as well as hypotension        Endocrine   Hypothyroidism - Primary  No complaints of fatigue or weight change Repeat TSH      Relevant Orders   TSH     Other   Palpitations    EKG normal Discussed stressors- none noted Exercising, low caffeine, low stress, low ETOH, good sleep      Relevant Medications   hydrOXYzine (VISTARIL) 25 MG capsule   Other Relevant Orders   EKG 12-Lead (Completed)   Abnormal flushing and sweating    Not similar to known hot flashes May or may not sweat      Relevant Orders   CBC with Differential/Platelet   Comprehensive metabolic panel   Hemoglobin A1c   Lipid panel   Magnesium   Vitamin B12   VITAMIN D 25 Hydroxy (Vit-D Deficiency, Fractures)   Hepatic function panel   Urinalysis     Return in about 4 weeks (around 02/13/2021), or if symptoms worsen or fail to improve.      Leilani Merl, FNP, have reviewed all documentation for this visit. The documentation on 01/16/21 for the exam, diagnosis, procedures, and orders are all accurate and complete.    Jacky Kindle, FNP  [facility] [phone] (phone) [phone] (fax)  [facility]

## 2021-01-16 NOTE — Assessment & Plan Note (Signed)
Acute concern Variable presentation Has tried to change intake, habits, and supplements to predict Unable to find pattern Routine blood work today Discussed possible causes include low overall blood volume as well as hypotension

## 2021-01-17 LAB — CBC WITH DIFFERENTIAL/PLATELET
Basophils Absolute: 0.1 10*3/uL (ref 0.0–0.2)
Basos: 1 %
EOS (ABSOLUTE): 0.2 10*3/uL (ref 0.0–0.4)
Eos: 3 %
Hematocrit: 41.4 % (ref 34.0–46.6)
Hemoglobin: 13.8 g/dL (ref 11.1–15.9)
Immature Grans (Abs): 0 10*3/uL (ref 0.0–0.1)
Immature Granulocytes: 0 %
Lymphocytes Absolute: 1.9 10*3/uL (ref 0.7–3.1)
Lymphs: 31 %
MCH: 31.2 pg (ref 26.6–33.0)
MCHC: 33.3 g/dL (ref 31.5–35.7)
MCV: 94 fL (ref 79–97)
Monocytes Absolute: 0.5 10*3/uL (ref 0.1–0.9)
Monocytes: 8 %
Neutrophils Absolute: 3.5 10*3/uL (ref 1.4–7.0)
Neutrophils: 57 %
Platelets: 286 10*3/uL (ref 150–450)
RBC: 4.42 x10E6/uL (ref 3.77–5.28)
RDW: 12.5 % (ref 11.7–15.4)
WBC: 6.1 10*3/uL (ref 3.4–10.8)

## 2021-01-17 LAB — COMPREHENSIVE METABOLIC PANEL
ALT: 15 IU/L (ref 0–32)
AST: 21 IU/L (ref 0–40)
Albumin/Globulin Ratio: 1.7 (ref 1.2–2.2)
Albumin: 4.6 g/dL (ref 3.8–4.8)
Alkaline Phosphatase: 59 IU/L (ref 44–121)
BUN/Creatinine Ratio: 11 (ref 9–23)
BUN: 11 mg/dL (ref 6–24)
Bilirubin Total: 0.3 mg/dL (ref 0.0–1.2)
CO2: 23 mmol/L (ref 20–29)
Calcium: 9.6 mg/dL (ref 8.7–10.2)
Chloride: 101 mmol/L (ref 96–106)
Creatinine, Ser: 1.01 mg/dL — ABNORMAL HIGH (ref 0.57–1.00)
Globulin, Total: 2.7 g/dL (ref 1.5–4.5)
Glucose: 92 mg/dL (ref 65–99)
Potassium: 4.8 mmol/L (ref 3.5–5.2)
Sodium: 139 mmol/L (ref 134–144)
Total Protein: 7.3 g/dL (ref 6.0–8.5)
eGFR: 71 mL/min/{1.73_m2} (ref >59)

## 2021-01-17 LAB — LIPID PANEL
Chol/HDL Ratio: 4.9 ratio — ABNORMAL HIGH (ref 0.0–4.4)
Cholesterol, Total: 196 mg/dL (ref 100–199)
HDL: 40 mg/dL
LDL Chol Calc (NIH): 129 mg/dL — ABNORMAL HIGH (ref 0–99)
Triglycerides: 152 mg/dL — ABNORMAL HIGH (ref 0–149)
VLDL Cholesterol Cal: 27 mg/dL (ref 5–40)

## 2021-01-17 LAB — URINALYSIS
Bilirubin, UA: NEGATIVE
Glucose, UA: NEGATIVE
Ketones, UA: NEGATIVE
Leukocytes,UA: NEGATIVE
Nitrite, UA: NEGATIVE
Protein,UA: NEGATIVE
RBC, UA: NEGATIVE
Specific Gravity, UA: 1.012 (ref 1.005–1.030)
Urobilinogen, Ur: 0.2 mg/dL (ref 0.2–1.0)
pH, UA: 7.5 (ref 5.0–7.5)

## 2021-01-17 LAB — HEMOGLOBIN A1C
Est. average glucose Bld gHb Est-mCnc: 111 mg/dL
Hgb A1c MFr Bld: 5.5 % (ref 4.8–5.6)

## 2021-01-17 LAB — MAGNESIUM: Magnesium: 2.1 mg/dL (ref 1.6–2.3)

## 2021-01-17 LAB — HEPATIC FUNCTION PANEL: Bilirubin, Direct: <0.1 mg/dL (ref 0.00–0.40)

## 2021-01-17 LAB — VITAMIN D 25 HYDROXY (VIT D DEFICIENCY, FRACTURES): Vit D, 25-Hydroxy: 24.4 ng/mL — ABNORMAL LOW (ref 30.0–100.0)

## 2021-01-17 LAB — VITAMIN B12: Vitamin B-12: 437 pg/mL (ref 232–1245)

## 2021-01-19 ENCOUNTER — Encounter: Payer: Self-pay | Admitting: Family Medicine

## 2021-01-21 ENCOUNTER — Other Ambulatory Visit: Payer: Self-pay | Admitting: Family Medicine

## 2021-01-21 DIAGNOSIS — E039 Hypothyroidism, unspecified: Secondary | ICD-10-CM

## 2021-01-22 LAB — TSH: TSH: 3.51 u[IU]/mL (ref 0.450–4.500)

## 2021-01-29 ENCOUNTER — Ambulatory Visit: Payer: BC Managed Care – PPO | Admitting: Internal Medicine

## 2021-03-09 ENCOUNTER — Ambulatory Visit (INDEPENDENT_AMBULATORY_CARE_PROVIDER_SITE_OTHER): Payer: BC Managed Care – PPO | Admitting: Family Medicine

## 2021-03-09 ENCOUNTER — Other Ambulatory Visit: Payer: Self-pay

## 2021-03-09 ENCOUNTER — Encounter: Payer: Self-pay | Admitting: Family Medicine

## 2021-03-09 VITALS — BP 106/68 | HR 71 | Temp 98.1°F | Resp 15 | Ht 64.0 in | Wt 158.4 lb

## 2021-03-09 DIAGNOSIS — Z0000 Encounter for general adult medical examination without abnormal findings: Secondary | ICD-10-CM | POA: Diagnosis not present

## 2021-03-09 DIAGNOSIS — R053 Chronic cough: Secondary | ICD-10-CM

## 2021-03-09 DIAGNOSIS — U099 Post covid-19 condition, unspecified: Secondary | ICD-10-CM | POA: Insufficient documentation

## 2021-03-09 DIAGNOSIS — R0789 Other chest pain: Secondary | ICD-10-CM | POA: Insufficient documentation

## 2021-03-09 DIAGNOSIS — R42 Dizziness and giddiness: Secondary | ICD-10-CM | POA: Insufficient documentation

## 2021-03-09 DIAGNOSIS — Z23 Encounter for immunization: Secondary | ICD-10-CM | POA: Insufficient documentation

## 2021-03-09 DIAGNOSIS — Z1231 Encounter for screening mammogram for malignant neoplasm of breast: Secondary | ICD-10-CM | POA: Diagnosis not present

## 2021-03-09 MED ORDER — ALBUTEROL SULFATE HFA 108 (90 BASE) MCG/ACT IN AERS
2.0000 | INHALATION_SPRAY | Freq: Four times a day (QID) | RESPIRATORY_TRACT | 2 refills | Status: DC | PRN
Start: 2021-03-09 — End: 2022-08-31

## 2021-03-09 NOTE — Progress Notes (Signed)
Complete physical exam   Patient: Anna Richmond   DOB: 1975/11/07   45 y.o. Female  MRN: [id number] Visit Date: 03/09/2021  Today's healthcare provider: Jacky Kindle, FNP   Chief Complaint  Patient presents with   Annual Exam   Subjective    Anna Richmond is a 45 y.o. female who presents today for a complete physical exam.  She reports consuming a general diet.  Patient reports that she is actively exercising 4-5x a week  ; walked 3 miles 10/16. She generally feels well. She reports sleeping fairly well. She does not have additional problems to discuss today.   UTD on eye dr and dentist. No longer having concerns with palpitations/flushing.   HPI    Past Medical History:  Diagnosis Date   Heart murmur    Substance abuse (HCC)    Thyroid disease    History reviewed. No pertinent surgical history. Social History   Socioeconomic History   Marital status: Married    Spouse name: Not on file   Number of children: Not on file   Years of education: Not on file   Highest education level: Not on file  Occupational History   Not on file  Tobacco Use   Smoking status: Never   Smokeless tobacco: Never  Vaping Use   Vaping Use: Never used  Substance and Sexual Activity   Alcohol use: Yes   Drug use: Never   Sexual activity: Yes    Birth control/protection: None  Other Topics Concern   Not on file  Social History Narrative   Not on file   Social Determinants of Health   Financial Resource Strain: Not on file  Food Insecurity: Not on file  Transportation Needs: Not on file  Physical Activity: Not on file  Stress: Not on file  Social Connections: Not on file  Intimate Partner Violence: Not on file   Family Status  Relation Name Status   Mother  Deceased at age 92       cancer lung   Father  Alive   MGM  Deceased   PGM  Deceased   PGF  Deceased   Brother  Alive   Brother  Alive   Family History  Problem Relation Age of Onset    Cancer Mother    Thyroid disease Mother    Hyperlipidemia Father    Hypertension Maternal Grandmother    Hypertension Paternal Grandmother    Stroke Paternal Grandmother    Diabetes Paternal Grandfather    Dementia Paternal Grandfather    Allergies  Allergen Reactions   Azithromycin Nausea And Vomiting   Sulfa Antibiotics Swelling and Rash    Patient Care Team: Jacky Kindle, FNP as PCP - General (Family Medicine)   Medications: Outpatient Medications Prior to Visit  Medication Sig   [DISCONTINUED] hydrOXYzine (VISTARIL) 25 MG capsule Take 1 capsule (25 mg total) by mouth every 8 (eight) hours as needed. (Patient not taking: Reported on 03/09/2021)   No facility-administered medications prior to visit.    Review of Systems  Neurological:  Positive for dizziness.  All other systems reviewed and are negative.  Intermittent dizziness, since COVID- recommend increased fluids. Will CTM.    Objective    BP 106/68   Pulse 71   Temp 98.1 F (36.7 C) (Oral)   Resp 15   Ht 5\' 4"  (1.626 m)   Wt 158 lb 6.4 oz (71.8 kg)   LMP 02/24/2021 (Exact Date)  SpO2 99%   BMI 27.19 kg/m    Physical Exam Vitals and nursing note reviewed.  Constitutional:      General: She is awake. She is not in acute distress.    Appearance: Normal appearance. She is well-developed, well-groomed and overweight. She is not ill-appearing, toxic-appearing or diaphoretic.  HENT:     Head: Normocephalic and atraumatic.     Jaw: There is normal jaw occlusion. No trismus, tenderness, swelling or pain on movement.     Right Ear: Hearing, tympanic membrane, ear canal and external ear normal. There is no impacted cerumen.     Left Ear: Hearing, tympanic membrane, ear canal and external ear normal. There is no impacted cerumen.     Nose: Nose normal. No congestion or rhinorrhea.     Right Turbinates: Not enlarged, swollen or pale.     Left Turbinates: Not enlarged, swollen or pale.     Right Sinus: No  maxillary sinus tenderness or frontal sinus tenderness.     Left Sinus: No maxillary sinus tenderness or frontal sinus tenderness.     Mouth/Throat:     Lips: Pink.     Mouth: Mucous membranes are moist. No injury.     Tongue: No lesions.     Pharynx: Oropharynx is clear. Uvula midline. No pharyngeal swelling, oropharyngeal exudate, posterior oropharyngeal erythema or uvula swelling.     Tonsils: No tonsillar exudate or tonsillar abscesses.  Eyes:     General: Lids are normal. Lids are everted, no foreign bodies appreciated. Vision grossly intact. Gaze aligned appropriately. No allergic shiner or visual field deficit.       Right eye: No discharge.        Left eye: No discharge.     Extraocular Movements: Extraocular movements intact.     Conjunctiva/sclera: Conjunctivae normal.     Right eye: Right conjunctiva is not injected. No exudate.    Left eye: Left conjunctiva is not injected. No exudate.    Pupils: Pupils are equal, round, and reactive to light.  Neck:     Thyroid: No thyroid mass, thyromegaly or thyroid tenderness.     Vascular: No carotid bruit.     Trachea: Trachea normal.  Cardiovascular:     Rate and Rhythm: Normal rate and regular rhythm.     Pulses: Normal pulses.          Carotid pulses are 2+ on the right side and 2+ on the left side.      Radial pulses are 2+ on the right side and 2+ on the left side.       Dorsalis pedis pulses are 2+ on the right side and 2+ on the left side.       Posterior tibial pulses are 2+ on the right side and 2+ on the left side.     Heart sounds: Normal heart sounds, S1 normal and S2 normal. No murmur heard.   No friction rub. No gallop.  Pulmonary:     Effort: Pulmonary effort is normal. No respiratory distress.     Breath sounds: Normal breath sounds and air entry. No stridor. No wheezing, rhonchi or rales.  Chest:     Chest wall: No tenderness.     Comments: Breast exam deferred; discussed 'know your lemons' campaign and self  exam Abdominal:     General: Abdomen is flat. Bowel sounds are normal. There is no distension.     Palpations: Abdomen is soft. There is no mass.     Tenderness:  There is no abdominal tenderness. There is no right CVA tenderness, left CVA tenderness, guarding or rebound.     Hernia: No hernia is present.  Genitourinary:    Comments: Exam deferred; denies complaints Musculoskeletal:        General: No swelling, tenderness, deformity or signs of injury. Normal range of motion.     Cervical back: Full passive range of motion without pain, normal range of motion and neck supple. No edema, rigidity or tenderness. No muscular tenderness.     Right lower leg: No edema.     Left lower leg: No edema.  Lymphadenopathy:     Cervical: No cervical adenopathy.     Right cervical: No superficial, deep or posterior cervical adenopathy.    Left cervical: No superficial, deep or posterior cervical adenopathy.  Skin:    General: Skin is warm and dry.     Capillary Refill: Capillary refill takes less than 2 seconds.     Coloration: Skin is not jaundiced or pale.     Findings: No bruising, erythema, lesion or rash.  Neurological:     General: No focal deficit present.     Mental Status: She is alert and oriented to person, place, and time. Mental status is at baseline.     GCS: GCS eye subscore is 4. GCS verbal subscore is 5. GCS motor subscore is 6.     Sensory: Sensation is intact. No sensory deficit.     Motor: Motor function is intact. No weakness.     Coordination: Coordination is intact. Coordination normal.     Gait: Gait is intact. Gait normal.  Psychiatric:        Attention and Perception: Attention and perception normal.        Mood and Affect: Mood and affect normal.        Speech: Speech normal.        Behavior: Behavior normal. Behavior is cooperative.        Thought Content: Thought content normal.        Cognition and Memory: Cognition and memory normal.        Judgment: Judgment  normal.    Last depression screening scores PHQ 2/9 Scores 01/16/2021 12/03/2019 11/10/2018  PHQ - 2 Score 0 1 1  PHQ- 9 Score 3 - 3   Last fall risk screening Fall Risk  01/16/2021  Falls in the past year? 0  Number falls in past yr: 0  Injury with Fall? 0  Risk for fall due to : No Fall Risks  Follow up -   Last Audit-C alcohol use screening Alcohol Use Disorder Test (AUDIT) 01/16/2021  1. How often do you have a drink containing alcohol? 2  2. How many drinks containing alcohol do you have on a typical day when you are drinking? 0  3. How often do you have six or more drinks on one occasion? 0  AUDIT-C Score 2  Alcohol Brief Interventions/Follow-up -   A score of 3 or more in women, and 4 or more in men indicates increased risk for alcohol abuse, EXCEPT if all of the points are from question 1   No results found for any visits on 03/09/21.  Assessment & Plan    Routine Health Maintenance and Physical Exam  Exercise Activities and Dietary recommendations  Goals   None     Immunization History  Administered Date(s) Administered   Influenza,inj,Quad PF,6+ Mos 03/30/2019, 03/09/2021   Influenza-Unspecified 04/22/2018   Moderna Sars-Covid-2 Vaccination 08/01/2019,  08/29/2019, 04/27/2020   Tdap 04/22/2018    Health Maintenance  Topic Date Due   PAP SMEAR-Modifier  11/10/2023   TETANUS/TDAP  04/22/2028   INFLUENZA VACCINE  Completed   COVID-19 Vaccine  Completed   Hepatitis C Screening  Completed   HIV Screening  Completed   HPV VACCINES  Aged Out    Discussed health benefits of physical activity, and encouraged her to engage in regular exercise appropriate for her age and condition.  Problem List Items Addressed This Visit       Other   Annual physical exam - Primary    Things to do to keep yourself healthy  - Exercise at least 30-45 minutes a day, 3-4 days a week.  - Eat a low-fat diet with lots of fruits and vegetables, up to 7-9 servings per day.  -  Seatbelts can save your life. Wear them always.  - Smoke detectors on every level of your home, check batteries every year.  - Eye Doctor - have an eye exam every 1-2 years  - Safe sex - if you may be exposed to STDs, use a condom.  - Alcohol -  If you drink, do it moderately, less than 2 drinks per day.  - Health Care Power of Attorney. Choose someone to speak for you if you are not able.  - Depression is common in our stressful world.If you're feeling down or losing interest in things you normally enjoy, please come in for a visit.  - Violence - If anyone is threatening or hurting you, please call immediately.        Need for influenza vaccination    provided      Relevant Orders   Flu Vaccine QUAD 67mo+IM (Fluarix, Fluzone & Alfiuria Quad PF) (Completed)   Encounter for screening mammogram for malignant neoplasm of breast    Denies complaints; refer for mammo      Relevant Orders   MS DIGITAL SCREENING TOMO BILATERAL   Sensation of chest tightness    S/s post covid cough- request for albuterol      Relevant Medications   albuterol (VENTOLIN HFA) 108 (90 Base) MCG/ACT inhaler   Post-COVID chronic cough    Request for albuterol      Dizziness    Recommend slow position changes Recommend increased fluids        No follow-ups on file.     Leilani Merl, FNP, have reviewed all documentation for this visit. The documentation on 03/09/21 for the exam, diagnosis, procedures, and orders are all accurate and complete.    Jacky Kindle, FNP  [facility] [phone] (phone) [phone] (fax)  [facility]

## 2021-03-09 NOTE — Assessment & Plan Note (Signed)
Recommend slow position changes Recommend increased fluids

## 2021-03-09 NOTE — Assessment & Plan Note (Signed)

## 2021-03-09 NOTE — Assessment & Plan Note (Signed)
Denies complaints; refer for mammo

## 2021-03-09 NOTE — Assessment & Plan Note (Signed)
provided ?

## 2021-03-09 NOTE — Assessment & Plan Note (Signed)
S/s post covid cough- request for albuterol

## 2021-03-09 NOTE — Assessment & Plan Note (Signed)
Request for albuterol

## 2021-10-04 IMAGING — CT CT PELVIS W/ CM
2 of 3 series · 17 of 46 positions shown, 19 images · IV contrast (omnipaque)
Comparison: None.

CLINICAL DATA: Left groin abscess

EXAM:
CT PELVIS WITH CONTRAST
TECHNIQUE: Multidetector CT imaging of the pelvis was performed using the
standard protocol following the bolus administration of intravenous
contrast.
CONTRAST:  100mL OMNIPAQUE IOHEXOL 300 MG/ML  SOLN

[Series 2: pelvis 5.00 · axial · 0.87mm/px · z∈[-1574,-1294]mm · 14 of 66 slices shown, 16 images]
[im 5/66  soft-tissue]
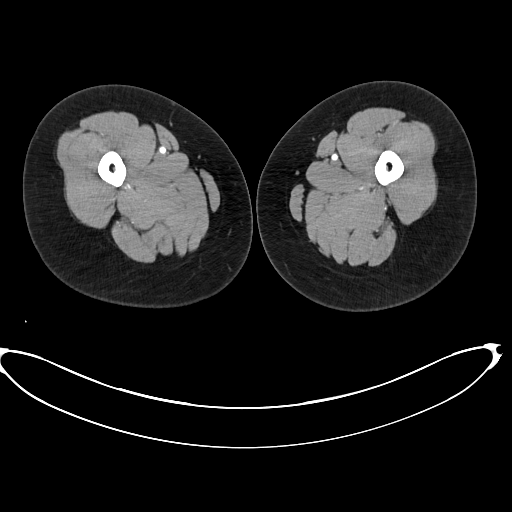
[im 5/66  bone]
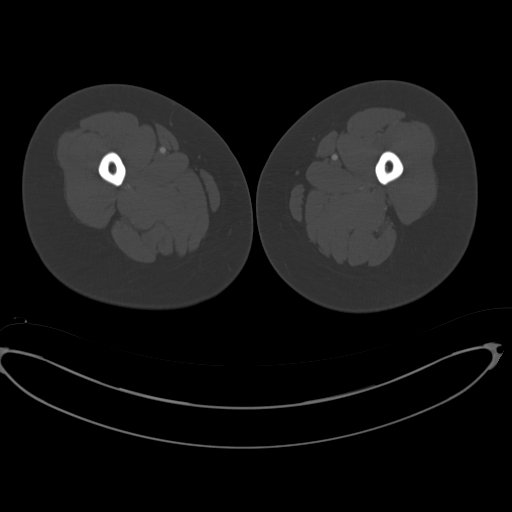
[im 9/66  soft-tissue]
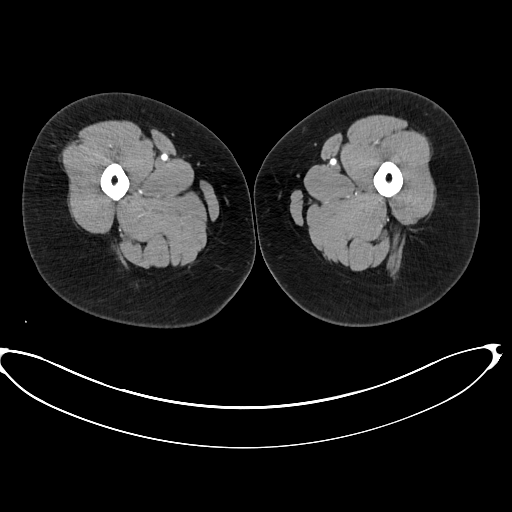
[im 13/66  soft-tissue]
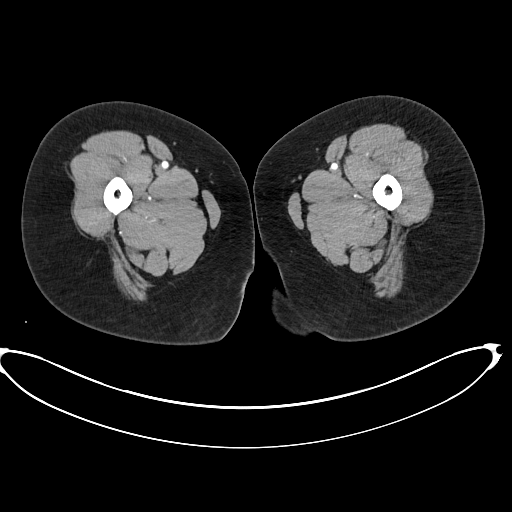
[im 17/66  soft-tissue]
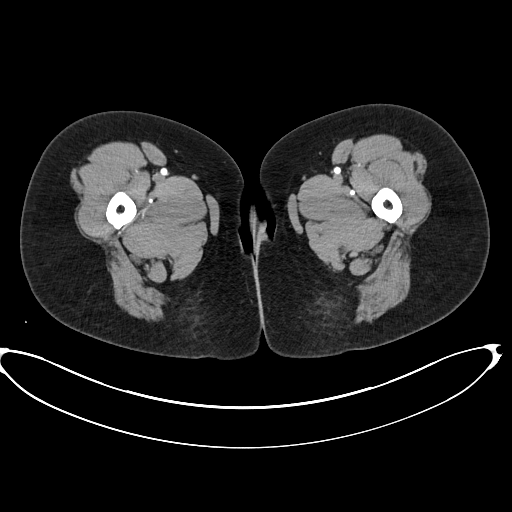
[im 21/66  soft-tissue]
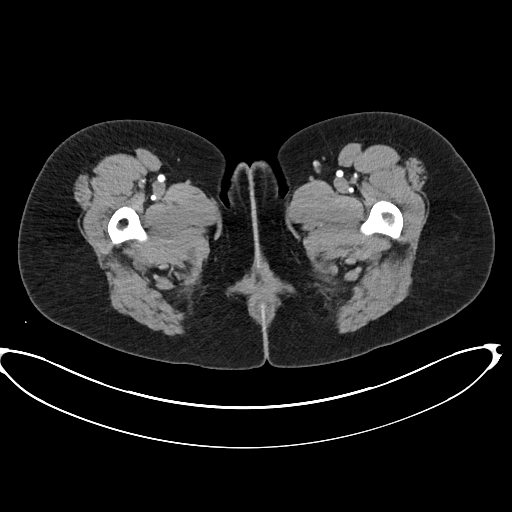
[im 26/66  soft-tissue]
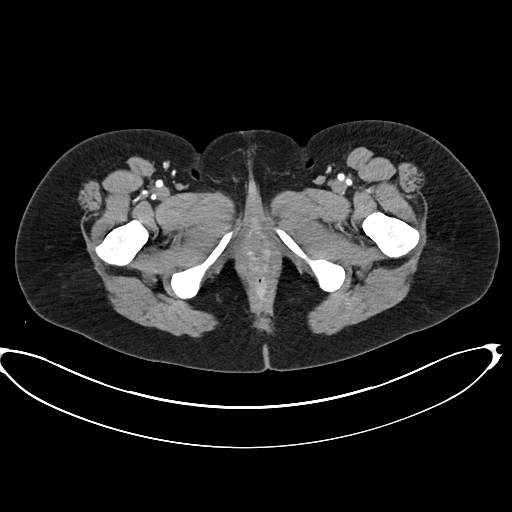
[im 30/66  soft-tissue]
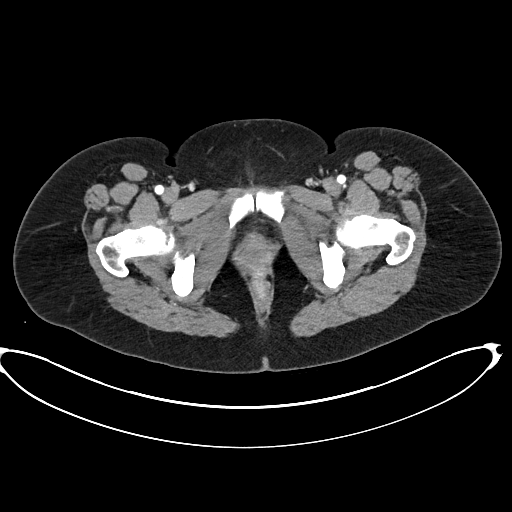
[im 36/66  soft-tissue]
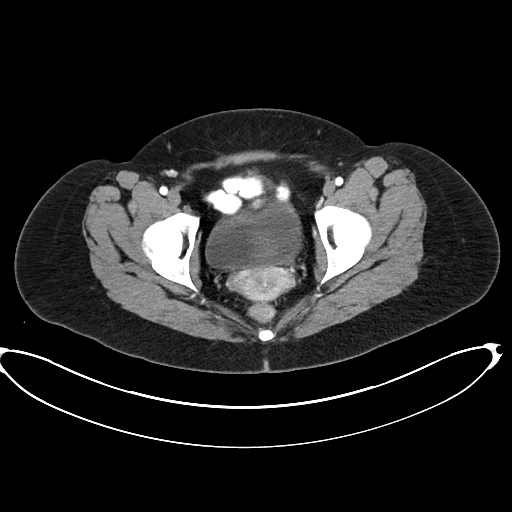
[im 40/66  soft-tissue]
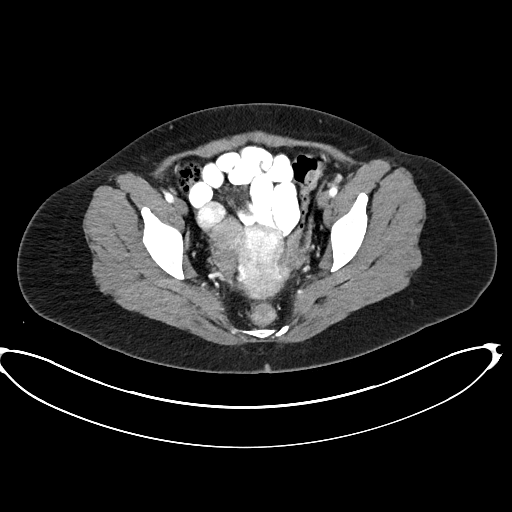
[im 40/66  bone]
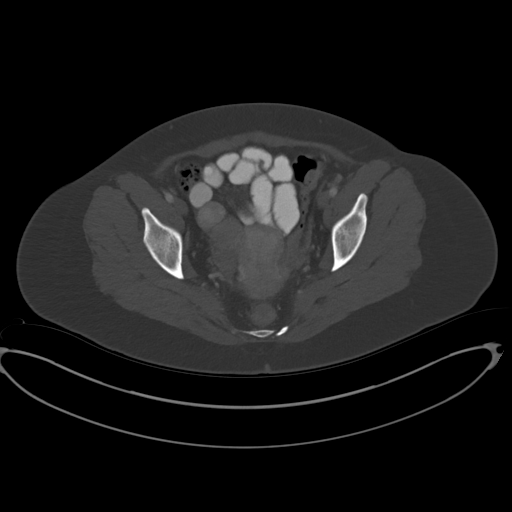
[im 45/66  soft-tissue]
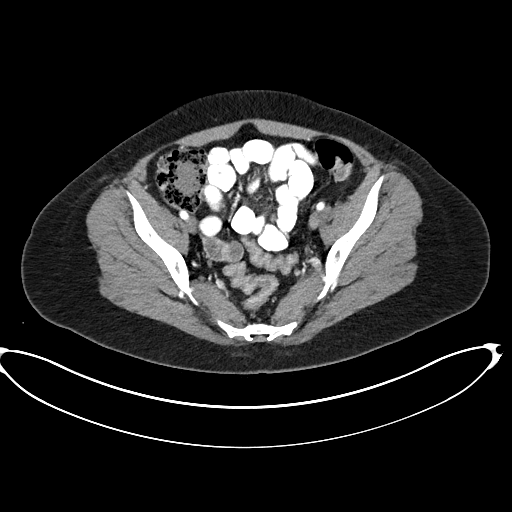
[im 49/66  soft-tissue]
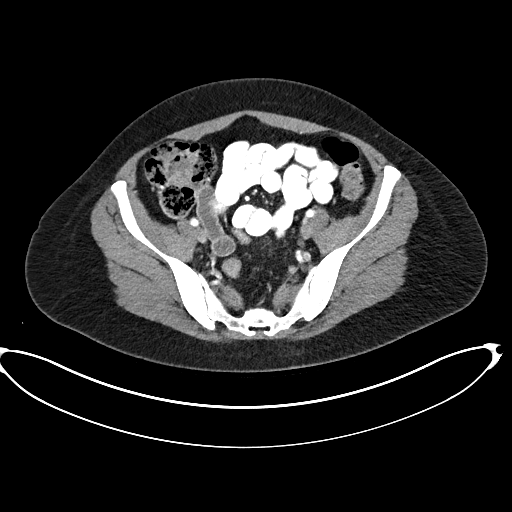
[im 53/66  soft-tissue]
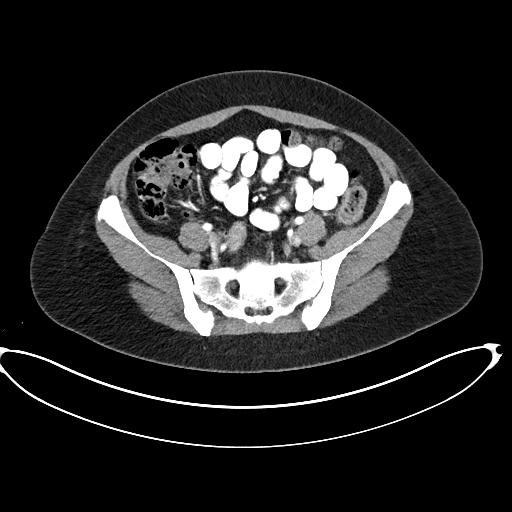
[im 57/66  soft-tissue]
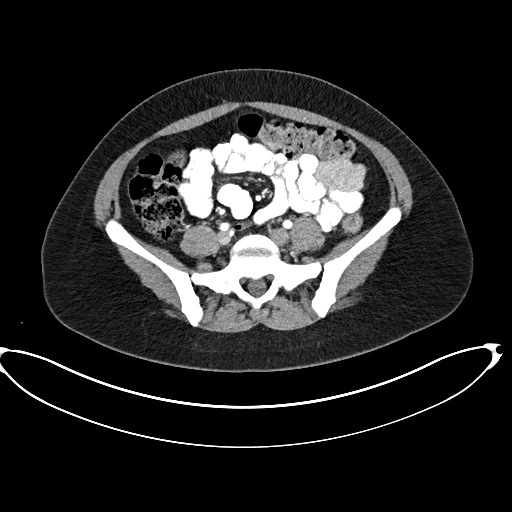
[im 61/66  soft-tissue]
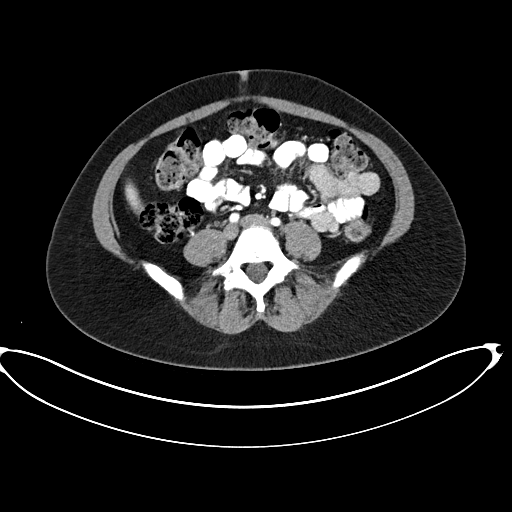

[Series 3: pelvis 2.00 cor · coronal · 0.65mm/px · 3 of 144 slices shown]
[im 48/144  soft-tissue]
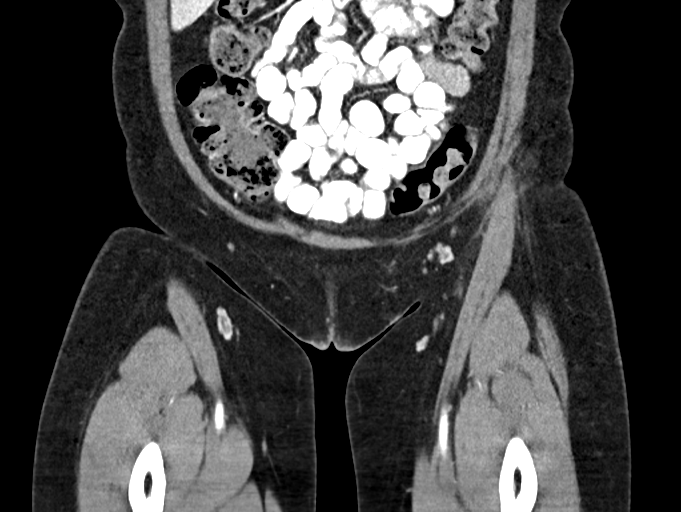
[im 64/144  soft-tissue]
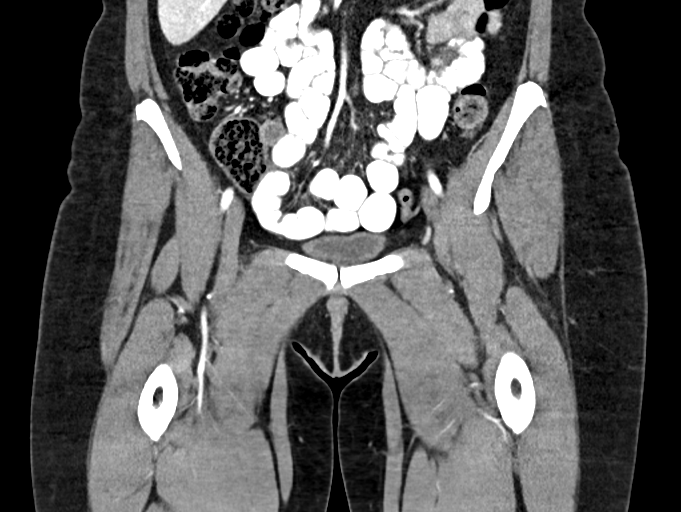
[im 80/144  soft-tissue]
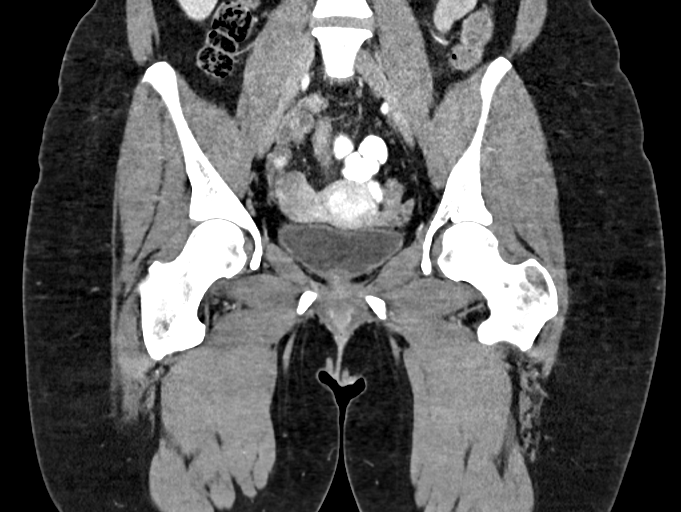

[17 of 46 positions shown; findings below may reference images not displayed]

FINDINGS: Urinary Tract:  No abnormality visualized.

Bowel:  Unremarkable visualized pelvic bowel loops.

Vascular/Lymphatic: No pathologically enlarged lymph nodes. No
significant vascular abnormality seen.

Reproductive:  No mass or other significant abnormality

Other: No free fluid or free air. No abnormality noted in the groin
region. No focal fluid collection to suggest abscess.

Musculoskeletal: No acute bony abnormality.
IMPRESSION: Unremarkable study.  No evidence of left groin abscess.

## 2022-03-10 NOTE — Progress Notes (Signed)
Complete physical exam   Patient: Anna Richmond   DOB: 1976-05-06   46 y.o. Female  MRN: [id number] Visit Date: 03/11/2022  Today's healthcare provider: Jacky Kindle, FNP  Introduced to nurse practitioner role and practice setting.  All questions answered.  Discussed provider/patient relationship and expectations.  I,Tiffany J Bragg,acting as a scribe for Jacky Kindle, FNP.,have documented all relevant documentation on the behalf of Jacky Kindle, FNP,as directed by  Jacky Kindle, FNP while in the presence of Jacky Kindle, FNP.   Chief Complaint  Patient presents with   Annual Exam   Subjective    Anna Richmond is a 46 y.o. female who presents today for a complete physical exam.  She reports consuming a general diet. Home exercise routine includes walking. She generally feels well. She reports sleeping poorly. She does have additional problems to discuss today. Patient complains of hot flashes mostly at night.  HPI   Past Medical History:  Diagnosis Date   Heart murmur    Substance abuse (HCC)    Thyroid disease    History reviewed. No pertinent surgical history. Social History   Socioeconomic History   Marital status: Married    Spouse name: Not on file   Number of children: Not on file   Years of education: Not on file   Highest education level: Not on file  Occupational History   Not on file  Tobacco Use   Smoking status: Never   Smokeless tobacco: Never  Vaping Use   Vaping Use: Never used  Substance and Sexual Activity   Alcohol use: Yes   Drug use: Never   Sexual activity: Yes    Birth control/protection: None  Other Topics Concern   Not on file  Social History Narrative   Not on file   Social Determinants of Health   Financial Resource Strain: Not on file  Food Insecurity: Not on file  Transportation Needs: Not on file  Physical Activity: Not on file  Stress: Not on file  Social Connections: Not on file  Intimate  Partner Violence: Not on file   Family Status  Relation Name Status   Mother  Deceased at age 34       cancer lung   Father  Alive   MGM  Deceased   PGM  Deceased   PGF  Deceased   Brother  Audiological scientist  Alive   Family History  Problem Relation Age of Onset   Cancer Mother    Thyroid disease Mother    Hyperlipidemia Father    Hypertension Maternal Grandmother    Hypertension Paternal Grandmother    Stroke Paternal Grandmother    Diabetes Paternal Grandfather    Dementia Paternal Grandfather    Allergies  Allergen Reactions   Azithromycin Nausea And Vomiting   Sulfa Antibiotics Swelling and Rash    Patient Care Team: Jacky Kindle, FNP as PCP - General (Family Medicine)   Medications: Outpatient Medications Prior to Visit  Medication Sig   albuterol (VENTOLIN HFA) 108 (90 Base) MCG/ACT inhaler Inhale 2 puffs into the lungs every 6 (six) hours as needed for wheezing or shortness of breath. (Patient not taking: Reported on 03/11/2022)   No facility-administered medications prior to visit.    Review of Systems   Objective    BP 109/65 (BP Location: Right Arm, Patient Position: Sitting, Cuff Size: Normal)   Pulse 73   Temp 98.2 F (36.8 C) (Oral)  Resp 16   Ht 5\' 4"  (1.626 m)   Wt 156 lb (70.8 kg)   SpO2 99%   BMI 26.78 kg/m   Physical Exam Vitals and nursing note reviewed.  Constitutional:      General: She is awake. She is not in acute distress.    Appearance: Normal appearance. She is well-developed, well-groomed and overweight. She is not ill-appearing, toxic-appearing or diaphoretic.  HENT:     Head: Normocephalic and atraumatic.     Jaw: There is normal jaw occlusion. No trismus, tenderness, swelling or pain on movement.     Right Ear: Hearing, tympanic membrane, ear canal and external ear normal. There is no impacted cerumen.     Left Ear: Hearing, tympanic membrane, ear canal and external ear normal. There is no impacted cerumen.     Nose: Nose  normal. No congestion or rhinorrhea.     Right Turbinates: Not enlarged, swollen or pale.     Left Turbinates: Not enlarged, swollen or pale.     Right Sinus: No maxillary sinus tenderness or frontal sinus tenderness.     Left Sinus: No maxillary sinus tenderness or frontal sinus tenderness.     Mouth/Throat:     Lips: Pink.     Mouth: Mucous membranes are moist. No injury.     Tongue: No lesions.     Pharynx: Oropharynx is clear. Uvula midline. No pharyngeal swelling, oropharyngeal exudate, posterior oropharyngeal erythema or uvula swelling.     Tonsils: No tonsillar exudate or tonsillar abscesses.  Eyes:     General: Lids are normal. Lids are everted, no foreign bodies appreciated. Vision grossly intact. Gaze aligned appropriately. No allergic shiner or visual field deficit.       Right eye: No discharge.        Left eye: No discharge.     Extraocular Movements: Extraocular movements intact.     Conjunctiva/sclera: Conjunctivae normal.     Right eye: Right conjunctiva is not injected. No exudate.    Left eye: Left conjunctiva is not injected. No exudate.    Pupils: Pupils are equal, round, and reactive to light.  Neck:     Thyroid: No thyroid mass, thyromegaly or thyroid tenderness.     Vascular: No carotid bruit.     Trachea: Trachea normal.  Cardiovascular:     Rate and Rhythm: Normal rate and regular rhythm.     Pulses: Normal pulses.          Carotid pulses are 2+ on the right side and 2+ on the left side.      Radial pulses are 2+ on the right side and 2+ on the left side.       Dorsalis pedis pulses are 2+ on the right side and 2+ on the left side.       Posterior tibial pulses are 2+ on the right side and 2+ on the left side.     Heart sounds: Normal heart sounds, S1 normal and S2 normal. No murmur heard.    No friction rub. No gallop.  Pulmonary:     Effort: Pulmonary effort is normal. No respiratory distress.     Breath sounds: Normal breath sounds and air entry. No  stridor. No wheezing, rhonchi or rales.  Chest:     Chest wall: No tenderness.     Comments: Breasts: breasts appear normal, no suspicious masses, no skin or nipple changes or axillary nodes, symmetric fibrous changes in both upper outer quadrants, right breast normal without  mass, skin or nipple changes or axillary nodes, left breast normal without mass, skin or nipple changes or axillary nodes, risk and benefit of breast self-exam was discussed  Abdominal:     General: Abdomen is flat. Bowel sounds are normal. There is no distension.     Palpations: Abdomen is soft. There is no mass.     Tenderness: There is no abdominal tenderness. There is no right CVA tenderness, left CVA tenderness, guarding or rebound.     Hernia: No hernia is present.  Genitourinary:    Comments: Exam deferred; denies complaints Musculoskeletal:        General: No swelling, tenderness, deformity or signs of injury. Normal range of motion.     Cervical back: Full passive range of motion without pain, normal range of motion and neck supple. No edema, rigidity or tenderness. No muscular tenderness.     Right lower leg: No edema.     Left lower leg: No edema.  Lymphadenopathy:     Cervical: No cervical adenopathy.     Right cervical: No superficial, deep or posterior cervical adenopathy.    Left cervical: No superficial, deep or posterior cervical adenopathy.  Skin:    General: Skin is warm and dry.     Capillary Refill: Capillary refill takes less than 2 seconds.     Coloration: Skin is not jaundiced or pale.     Findings: No bruising, erythema, lesion or rash.  Neurological:     General: No focal deficit present.     Mental Status: She is alert and oriented to person, place, and time. Mental status is at baseline.     GCS: GCS eye subscore is 4. GCS verbal subscore is 5. GCS motor subscore is 6.     Sensory: Sensation is intact. No sensory deficit.     Motor: Motor function is intact. No weakness.      Coordination: Coordination is intact. Coordination normal.     Gait: Gait is intact. Gait normal.  Psychiatric:        Attention and Perception: Attention and perception normal.        Mood and Affect: Mood and affect normal.        Speech: Speech normal.        Behavior: Behavior normal. Behavior is cooperative.        Thought Content: Thought content normal.        Cognition and Memory: Cognition and memory normal.        Judgment: Judgment normal.     Last depression screening scores    03/11/2022    9:24 AM 01/16/2021    8:47 AM 12/03/2019    3:09 PM  PHQ 2/9 Scores  PHQ - 2 Score 0 0 1  PHQ- 9 Score 2 3    Last fall risk screening    03/11/2022    9:24 AM  Fall Risk   Falls in the past year? 0  Number falls in past yr: 0  Injury with Fall? 0  Risk for fall due to : No Fall Risks  Follow up Falls evaluation completed   Last Audit-C alcohol use screening    03/11/2022    9:24 AM  Alcohol Use Disorder Test (AUDIT)  1. How often do you have a drink containing alcohol? 1  2. How many drinks containing alcohol do you have on a typical day when you are drinking? 0  3. How often do you have six or more drinks on one occasion?  0  AUDIT-C Score 1   A score of 3 or more in women, and 4 or more in men indicates increased risk for alcohol abuse, EXCEPT if all of the points are from question 1   No results found for any visits on 03/11/22.  Assessment & Plan    Routine Health Maintenance and Physical Exam  Exercise Activities and Dietary recommendations  Goals   None     Immunization History  Administered Date(s) Administered   Influenza,inj,Quad PF,6+ Mos 03/30/2019, 03/09/2021   Influenza-Unspecified 04/22/2018, 02/09/2022   Moderna Sars-Covid-2 Vaccination 08/01/2019, 08/29/2019, 04/27/2020   Tdap 04/22/2018    Health Maintenance  Topic Date Due   COVID-19 Vaccine (4 - Moderna series) 06/22/2020   PAP SMEAR-Modifier  11/10/2023   TETANUS/TDAP  04/22/2028    INFLUENZA VACCINE  Completed   Hepatitis C Screening  Completed   HIV Screening  Completed   HPV VACCINES  Aged Out    Discussed health benefits of physical activity, and encouraged her to engage in regular exercise appropriate for her age and condition.  Problem List Items Addressed This Visit       Musculoskeletal and Integument   Flexural atopic dermatitis    Chronic, stable Request for medication refill      Relevant Medications   clobetasol ointment (TEMOVATE) 0.05 %     Other   Annual physical exam - Primary    Due for vision Eye exam pending soon Needs colon cancer screening next year Things to do to keep yourself healthy  - Exercise at least 30-45 minutes a day, 3-4 days a week.  - Eat a low-fat diet with lots of fruits and vegetables, up to 7-9 servings per day.  - Seatbelts can save your life. Wear them always.  - Smoke detectors on every level of your home, check batteries every year.  - Eye Doctor - have an eye exam every 1-2 years  - Safe sex - if you may be exposed to STDs, use a condom.  - Alcohol -  If you drink, do it moderately, less than 2 drinks per day.  - Health Care Power of Attorney. Choose someone to speak for you if you are not able.  - Depression is common in our stressful world.If you're feeling down or losing interest in things you normally enjoy, please come in for a visit.  - Violence - If anyone is threatening or hurting you, please call immediately.        Relevant Orders   CBC with Differential/Platelet   Comprehensive Metabolic Panel (CMET)   Lipid panel   TSH + free T4   Avitaminosis D    Chronic, has stopped supplementation Repeat labs      Relevant Orders   Vitamin D (25 hydroxy)   Irregular menses    Acute on chronic x 6 months Recommend FSH/LH testing given vasomotor symptoms       Relevant Orders   B12 and Folate Panel   Vitamin B6   FSH/LH   Menopausal vasomotor syndrome    Acute on chronic, x 6 months Reports  mom went into menopause lats 40s      Relevant Orders   B12 and Folate Panel   Vitamin B6   Screening mammogram for breast cancer    Due for screening for mammogram, denies breast concerns, provided with phone number to call and schedule appointment for mammogram. Encouraged to repeat breast cancer screening every 1-2 years.       Relevant  Orders   MM 3D SCREEN BREAST BILATERAL     Return in about 1 year (around 03/12/2023) for annual examination.     Leilani Merl, FNP, have reviewed all documentation for this visit. The documentation on 03/11/22 for the exam, diagnosis, procedures, and orders are all accurate and complete.    Jacky Kindle, FNP  [facility] [phone] (phone) [phone] (fax)  [facility]

## 2022-03-11 ENCOUNTER — Ambulatory Visit (INDEPENDENT_AMBULATORY_CARE_PROVIDER_SITE_OTHER): Payer: 59 | Admitting: Family Medicine

## 2022-03-11 ENCOUNTER — Encounter: Payer: Self-pay | Admitting: Family Medicine

## 2022-03-11 VITALS — BP 109/65 | HR 73 | Temp 98.2°F | Resp 16 | Ht 64.0 in | Wt 156.0 lb

## 2022-03-11 DIAGNOSIS — L2089 Other atopic dermatitis: Secondary | ICD-10-CM

## 2022-03-11 DIAGNOSIS — Z1231 Encounter for screening mammogram for malignant neoplasm of breast: Secondary | ICD-10-CM

## 2022-03-11 DIAGNOSIS — Z0000 Encounter for general adult medical examination without abnormal findings: Secondary | ICD-10-CM | POA: Diagnosis not present

## 2022-03-11 DIAGNOSIS — N951 Menopausal and female climacteric states: Secondary | ICD-10-CM

## 2022-03-11 DIAGNOSIS — E559 Vitamin D deficiency, unspecified: Secondary | ICD-10-CM

## 2022-03-11 DIAGNOSIS — N926 Irregular menstruation, unspecified: Secondary | ICD-10-CM | POA: Insufficient documentation

## 2022-03-11 MED ORDER — CLOBETASOL PROPIONATE 0.05 % EX OINT: 1.0000 | TOPICAL_OINTMENT | Freq: Two times a day (BID) | CUTANEOUS | 0 refills | Status: DC

## 2022-03-11 NOTE — Assessment & Plan Note (Signed)
Chronic, has stopped supplementation Repeat labs

## 2022-03-11 NOTE — Patient Instructions (Signed)
Please call and schedule your mammogram:  [facility]  [address], [address] [facility] [place],    [address] Get Driving Directions Main: [phone]  Sunday:Closed Monday:7:20 AM - 5:00 PM Tuesday:7:20 AM - 5:00 PM Wednesday:7:20 AM - 5:00 PM Thursday:7:20 AM - 5:00 PM Friday:7:20 AM - 4:30 PM Saturday:Closed  

## 2022-03-11 NOTE — Assessment & Plan Note (Signed)
Due for vision Eye exam pending soon Needs colon cancer screening next year Things to do to keep yourself healthy  - Exercise at least 30-45 minutes a day, 3-4 days a week.  - Eat a low-fat diet with lots of fruits and vegetables, up to 7-9 servings per day.  - Seatbelts can save your life. Wear them always.  - Smoke detectors on every level of your home, check batteries every year.  - Eye Doctor - have an eye exam every 1-2 years  - Safe sex - if you may be exposed to STDs, use a condom.  - Alcohol -  If you drink, do it moderately, less than 2 drinks per day.  - [place]. Choose someone to speak for you if you are not able.  - Depression is common in our stressful world.If you're feeling down or losing interest in things you normally enjoy, please come in for a visit.  - Violence - If anyone is threatening or hurting you, please call immediately.

## 2022-03-11 NOTE — Assessment & Plan Note (Signed)
Due for screening for mammogram, denies breast concerns, provided with phone number to call and schedule appointment for mammogram. Encouraged to repeat breast cancer screening every 1-2 years.  

## 2022-03-11 NOTE — Assessment & Plan Note (Signed)
Acute on chronic, x 6 months Reports mom went into menopause lats 63s

## 2022-03-11 NOTE — Assessment & Plan Note (Signed)
Acute on chronic x 6 months Recommend FSH/LH testing given vasomotor symptoms

## 2022-03-11 NOTE — Assessment & Plan Note (Signed)
Chronic, stable Request for medication refill  

## 2022-03-12 NOTE — Progress Notes (Signed)
Stable elevation noted in creatinine. However, normal kidney filtration rate- eGFR. Cholesterol is slightly increased. The 10-year ASCVD risk score (Arnett DK, et al., 2019) is: 1.1%   Values used to calculate the score:     Age: 46 years     Sex: Female     Is Non-Hispanic African American: No     Diabetic: No     Tobacco smoker: No     Systolic Blood Pressure: 199 mmHg     Is BP treated: No     HDL Cholesterol: 41 mg/dL     Total Cholesterol: 222 mg/dL Heart attack and stroke risk is 1% estimated within the next 10 years which is low.  Continue to recommend 5000 IU Vit D daily to assist avitaminosis D; or we can Rx a weekly high dose supplement.  Hormone levels confirm peri-menopause; all other labs are normal and stable.  Gwyneth Sprout, [address] [place], [place] [address] [phone] (phone) [phone] (fax) [place]

## 2022-03-17 LAB — LIPID PANEL
Chol/HDL Ratio: 5.4 ratio — ABNORMAL HIGH (ref 0.0–4.4)
Cholesterol, Total: 222 mg/dL — ABNORMAL HIGH (ref 100–199)
HDL: 41 mg/dL (ref >39)
LDL Chol Calc (NIH): 141 mg/dL — ABNORMAL HIGH (ref 0–99)
Triglycerides: 222 mg/dL — ABNORMAL HIGH (ref 0–149)
VLDL Cholesterol Cal: 40 mg/dL (ref 5–40)

## 2022-03-17 LAB — COMPREHENSIVE METABOLIC PANEL
ALT: 16 IU/L (ref 0–32)
AST: 20 IU/L (ref 0–40)
Albumin/Globulin Ratio: 1.5 (ref 1.2–2.2)
Albumin: 4.6 g/dL (ref 3.9–4.9)
Alkaline Phosphatase: 72 IU/L (ref 44–121)
BUN/Creatinine Ratio: 16 (ref 9–23)
BUN: 16 mg/dL (ref 6–24)
Bilirubin Total: 0.4 mg/dL (ref 0.0–1.2)
CO2: 23 mmol/L (ref 20–29)
Calcium: 9.9 mg/dL (ref 8.7–10.2)
Chloride: 101 mmol/L (ref 96–106)
Creatinine, Ser: 1.02 mg/dL — ABNORMAL HIGH (ref 0.57–1.00)
Globulin, Total: 3 g/dL (ref 1.5–4.5)
Glucose: 87 mg/dL (ref 70–99)
Potassium: 4.6 mmol/L (ref 3.5–5.2)
Sodium: 139 mmol/L (ref 134–144)
Total Protein: 7.6 g/dL (ref 6.0–8.5)
eGFR: 70 mL/min/{1.73_m2} (ref >59)

## 2022-03-17 LAB — B12 AND FOLATE PANEL
Folate: 10.2 ng/mL (ref >3.0)
Vitamin B-12: 326 pg/mL (ref 232–1245)

## 2022-03-17 LAB — CBC WITH DIFFERENTIAL/PLATELET
Basophils Absolute: 0 10*3/uL (ref 0.0–0.2)
Basos: 1 %
EOS (ABSOLUTE): 0.3 10*3/uL (ref 0.0–0.4)
Eos: 4 %
Hematocrit: 41.9 % (ref 34.0–46.6)
Hemoglobin: 14 g/dL (ref 11.1–15.9)
Immature Grans (Abs): 0 10*3/uL (ref 0.0–0.1)
Immature Granulocytes: 0 %
Lymphocytes Absolute: 2.1 10*3/uL (ref 0.7–3.1)
Lymphs: 35 %
MCH: 30.6 pg (ref 26.6–33.0)
MCHC: 33.4 g/dL (ref 31.5–35.7)
MCV: 92 fL (ref 79–97)
Monocytes Absolute: 0.4 10*3/uL (ref 0.1–0.9)
Monocytes: 7 %
Neutrophils Absolute: 3.2 10*3/uL (ref 1.4–7.0)
Neutrophils: 53 %
Platelets: 304 10*3/uL (ref 150–450)
RBC: 4.57 x10E6/uL (ref 3.77–5.28)
RDW: 12.5 % (ref 11.7–15.4)
WBC: 6 10*3/uL (ref 3.4–10.8)

## 2022-03-17 LAB — FSH/LH
FSH: 91.8 m[IU]/mL
LH: 57.8 m[IU]/mL

## 2022-03-17 LAB — TSH+FREE T4
Free T4: 1.08 ng/dL (ref 0.82–1.77)
TSH: 3.05 u[IU]/mL (ref 0.450–4.500)

## 2022-03-17 LAB — VITAMIN D 25 HYDROXY (VIT D DEFICIENCY, FRACTURES): Vit D, 25-Hydroxy: 24.3 ng/mL — ABNORMAL LOW (ref 30.0–100.0)

## 2022-03-17 LAB — VITAMIN B6: Vitamin B6: 16 ug/L (ref 3.4–65.2)

## 2022-04-23 ENCOUNTER — Ambulatory Visit
Admission: RE | Admit: 2022-04-23 | Discharge: 2022-04-23 | Disposition: A | Discharge status: Home / Self Care | Payer: 59 | Source: Ambulatory Visit | Attending: Family Medicine | Admitting: Family Medicine

## 2022-04-23 DIAGNOSIS — Z1231 Encounter for screening mammogram for malignant neoplasm of breast: Secondary | ICD-10-CM | POA: Diagnosis not present

## 2022-04-27 NOTE — Progress Notes (Signed)
Hi Anna Richmond,  Normal mammogram; repeat in 1 year.  Please let us know if you have any questions.  Thank you,  Merita Norton, FNP

## 2022-08-30 NOTE — Progress Notes (Unsigned)
I,J'ya E Valaree Fresquez,acting as a scribe for Jacky Kindle, FNP.,have documented all relevant documentation on the behalf of Jacky Kindle, FNP,as directed by  Jacky Kindle, FNP while in the presence of Jacky Kindle, FNP.   Established patient visit   Patient: Anna Richmond   DOB: 12/20/1975   47 y.o. Female  MRN: [id number] Visit Date: 08/31/2022  Today's healthcare provider: Jacky Kindle, FNP   Chief Complaint  Patient presents with   Insomnia   Subjective    Insomnia Primary symptoms: fragmented sleep, frequent awakening, malaise/fatigue.   The current episode started one month. The problem occurs nightly. The problem has been gradually worsening since onset. The symptoms are aggravated by menstrual cycle (Last period was September). How many beverages per day that contain caffeine: 0 - 1.  Past treatments include medication (Valerian). The treatment provided mild relief. Typical bedtime:  8-10 P.M..  Duration of naps:  One to two hours.  PMH includes: family stress or anxiety ("normal").     INSOMNIA Duration: 4 weeks Satisfied with sleep quality: no Difficulty falling asleep: no Difficulty staying asleep: yes Waking a few hours after sleep onset: no Early morning awakenings: no Daytime hypersomnolence: yes Wakes feeling refreshed: no Good sleep hygiene: yes Apnea: no Snoring: no Depressed/anxious mood: no Recent stress: no Restless legs/nocturnal leg cramps: no Chronic pain/arthritis: no History of sleep study: no Treatments attempted: valerian     Medications: Outpatient Medications Prior to Visit  Medication Sig   clobetasol ointment (TEMOVATE) 0.05 % Apply 1 Application topically 2 (two) times daily. Apply to dermatitis areas as needed; avoid axilla or groin folds   [DISCONTINUED] albuterol (VENTOLIN HFA) 108 (90 Base) MCG/ACT inhaler Inhale 2 puffs into the lungs every 6 (six) hours as needed for wheezing or shortness of breath. (Patient not taking:  Reported on 03/11/2022)   No facility-administered medications prior to visit.    Review of Systems  Constitutional:  Positive for malaise/fatigue.  Psychiatric/Behavioral:  The patient has insomnia.        Objective    BP (!) 95/57 (BP Location: Right Arm, Patient Position: Sitting, Cuff Size: Large)   Pulse 63   Temp 98.1 F (36.7 C) (Oral)   Resp 14   Ht 5\' 4"  (1.626 m)   Wt 158 lb (71.7 kg)   LMP 01/22/2022 (Approximate)   SpO2 97%   BMI 27.12 kg/m    Physical Exam Vitals and nursing note reviewed.  Constitutional:      General: She is not in acute distress.    Appearance: Normal appearance. She is overweight. She is not ill-appearing, toxic-appearing or diaphoretic.  HENT:     Head: Normocephalic and atraumatic.  Cardiovascular:     Rate and Rhythm: Normal rate and regular rhythm.     Pulses: Normal pulses.     Heart sounds: Normal heart sounds. No murmur heard.    No friction rub. No gallop.  Pulmonary:     Effort: Pulmonary effort is normal. No respiratory distress.     Breath sounds: Normal breath sounds. No stridor. No wheezing, rhonchi or rales.  Chest:     Chest wall: No tenderness.  Musculoskeletal:        General: No swelling, tenderness, deformity or signs of injury. Normal range of motion.     Right lower leg: No edema.     Left lower leg: No edema.  Skin:    General: Skin is warm and dry.  Capillary Refill: Capillary refill takes less than 2 seconds.     Coloration: Skin is not jaundiced or pale.     Findings: No bruising, erythema, lesion or rash.  Neurological:     General: No focal deficit present.     Mental Status: She is alert and oriented to person, place, and time. Mental status is at baseline.     Cranial Nerves: No cranial nerve deficit.     Sensory: No sensory deficit.     Motor: No weakness.     Coordination: Coordination normal.  Psychiatric:        Mood and Affect: Mood normal.        Behavior: Behavior normal.         Thought Content: Thought content normal.        Judgment: Judgment normal.     No results found for any visits on 08/31/22.  Assessment & Plan     Problem List Items Addressed This Visit       Cardiovascular and Mediastinum   Hypotension    Acute, self limiting Patient reports good PO intake of both food and drink Denies concerns of sick/illness Denies concerns of dizziness including flushing, ringing of the ears etc Continue to monitor.         Other   Sleep disturbance - Primary    Acute, worsening 4 weeks of s/s Pt has weaned off all caffeine Reports slight stress; however, not concerning Patient denies sleep disturbance, noting that she and spouse sleep in different rooms Has tried OTC valarian with slight relief Will repeat all labs including hormone and thyroid Slight weight gain per pt report Slight hypotension noted on exam Trial of trazodone to assist; return to clinic as needed       Relevant Medications   traZODone (DESYREL) 50 MG tablet   Other Relevant Orders   CBC with Differential/Platelet   Comprehensive Metabolic Panel (CMET)   Hemoglobin A1c   TSH + free T4   FSH/LH   Vitamin D (25 hydroxy)   Lipid panel   Weight gain    Per pt report Body mass index is 27.12 kg/m. Will complete screening labs; no focal concerns noted outside of sleep disturbances       Return if symptoms worsen or fail to improve.     Leilani Merl, FNP, have reviewed all documentation for this visit. The documentation on 08/31/22 for the exam, diagnosis, procedures, and orders are all accurate and complete.  Jacky Kindle, FNP  [facility] [phone] (phone) [phone] (fax)  [facility]

## 2022-08-31 ENCOUNTER — Ambulatory Visit: Payer: 59 | Admitting: Family Medicine

## 2022-08-31 ENCOUNTER — Encounter: Payer: Self-pay | Admitting: Family Medicine

## 2022-08-31 VITALS — BP 95/57 | HR 63 | Temp 98.1°F | Resp 14 | Ht 64.0 in | Wt 158.0 lb

## 2022-08-31 DIAGNOSIS — G479 Sleep disorder, unspecified: Secondary | ICD-10-CM | POA: Insufficient documentation

## 2022-08-31 DIAGNOSIS — R635 Abnormal weight gain: Secondary | ICD-10-CM | POA: Insufficient documentation

## 2022-08-31 DIAGNOSIS — I959 Hypotension, unspecified: Secondary | ICD-10-CM | POA: Diagnosis not present

## 2022-08-31 MED ORDER — TRAZODONE HCL 50 MG PO TABS: 25.0000 mg | ORAL_TABLET | Freq: Every evening | ORAL | 3 refills | Status: DC | PRN

## 2022-08-31 NOTE — Assessment & Plan Note (Signed)
Per pt report Body mass index is 27.12 kg/m. Will complete screening labs; no focal concerns noted outside of sleep disturbances

## 2022-08-31 NOTE — Assessment & Plan Note (Signed)
Acute, worsening 4 weeks of s/s Pt has weaned off all caffeine Reports slight stress; however, not concerning Patient denies sleep disturbance, noting that she and spouse sleep in different rooms Has tried OTC valarian with slight relief Will repeat all labs including hormone and thyroid Slight weight gain per pt report Slight hypotension noted on exam Trial of trazodone to assist; return to clinic as needed

## 2022-08-31 NOTE — Assessment & Plan Note (Signed)
Acute, self limiting Patient reports good PO intake of both food and drink Denies concerns of sick/illness Denies concerns of dizziness including flushing, ringing of the ears etc Continue to monitor.

## 2022-09-01 LAB — COMPREHENSIVE METABOLIC PANEL
ALT: 28 IU/L (ref 0–32)
AST: 23 IU/L (ref 0–40)
Albumin/Globulin Ratio: 1.7 (ref 1.2–2.2)
Albumin: 4.2 g/dL (ref 3.9–4.9)
Alkaline Phosphatase: 75 IU/L (ref 44–121)
BUN/Creatinine Ratio: 19 (ref 9–23)
BUN: 16 mg/dL (ref 6–24)
Bilirubin Total: 0.3 mg/dL (ref 0.0–1.2)
CO2: 22 mmol/L (ref 20–29)
Calcium: 8.7 mg/dL (ref 8.7–10.2)
Chloride: 104 mmol/L (ref 96–106)
Creatinine, Ser: 0.85 mg/dL (ref 0.57–1.00)
Globulin, Total: 2.5 g/dL (ref 1.5–4.5)
Glucose: 104 mg/dL — ABNORMAL HIGH (ref 70–99)
Potassium: 4.5 mmol/L (ref 3.5–5.2)
Sodium: 138 mmol/L (ref 134–144)
Total Protein: 6.7 g/dL (ref 6.0–8.5)
eGFR: 87 mL/min/{1.73_m2} (ref >59)

## 2022-09-01 LAB — FSH/LH
FSH: 31.8 m[IU]/mL
LH: 28 m[IU]/mL

## 2022-09-01 LAB — CBC WITH DIFFERENTIAL/PLATELET
Basophils Absolute: 0 10*3/uL (ref 0.0–0.2)
Basos: 1 %
EOS (ABSOLUTE): 0.2 10*3/uL (ref 0.0–0.4)
Eos: 3 %
Hematocrit: 38.4 % (ref 34.0–46.6)
Hemoglobin: 13.1 g/dL (ref 11.1–15.9)
Immature Grans (Abs): 0 10*3/uL (ref 0.0–0.1)
Immature Granulocytes: 0 %
Lymphocytes Absolute: 2.2 10*3/uL (ref 0.7–3.1)
Lymphs: 29 %
MCH: 31.2 pg (ref 26.6–33.0)
MCHC: 34.1 g/dL (ref 31.5–35.7)
MCV: 91 fL (ref 79–97)
Monocytes Absolute: 0.7 10*3/uL (ref 0.1–0.9)
Monocytes: 9 %
Neutrophils Absolute: 4.5 10*3/uL (ref 1.4–7.0)
Neutrophils: 58 %
Platelets: 271 10*3/uL (ref 150–450)
RBC: 4.2 x10E6/uL (ref 3.77–5.28)
RDW: 13.3 % (ref 11.7–15.4)
WBC: 7.6 10*3/uL (ref 3.4–10.8)

## 2022-09-01 LAB — TSH+FREE T4
Free T4: 0.86 ng/dL (ref 0.82–1.77)
TSH: 4.83 u[IU]/mL — ABNORMAL HIGH (ref 0.450–4.500)

## 2022-09-01 LAB — LIPID PANEL
Chol/HDL Ratio: 5.2 ratio — ABNORMAL HIGH (ref 0.0–4.4)
Cholesterol, Total: 177 mg/dL (ref 100–199)
HDL: 34 mg/dL — ABNORMAL LOW (ref >39)
LDL Chol Calc (NIH): 91 mg/dL (ref 0–99)
Triglycerides: 309 mg/dL — ABNORMAL HIGH (ref 0–149)
VLDL Cholesterol Cal: 52 mg/dL — ABNORMAL HIGH (ref 5–40)

## 2022-09-01 LAB — HEMOGLOBIN A1C
Est. average glucose Bld gHb Est-mCnc: 105 mg/dL
Hgb A1c MFr Bld: 5.3 % (ref 4.8–5.6)

## 2022-09-01 LAB — VITAMIN D 25 HYDROXY (VIT D DEFICIENCY, FRACTURES): Vit D, 25-Hydroxy: 24 ng/mL — ABNORMAL LOW (ref 30.0–100.0)

## 2022-09-01 NOTE — Progress Notes (Signed)
Hi Anna,  - Normal blood chemistry - Stable low Vit D. Continue to recommend 5000 IU daily OTC or 50,000 weekly as Rx. - Cholesterol is improved; The 10-year ASCVD risk score (Arnett DK, et al., 2019) is: 0.8%   Values used to calculate the score:     Age: 47 years     Sex: Female     Is Non-Hispanic African American: No     Diabetic: No     Tobacco smoker: No     Systolic Blood Pressure: 95 mmHg     Is BP treated: No     HDL Cholesterol: 34 mg/dL     Total Cholesterol: 177 mg/dL - Thyroid shows slightly elevated TSH; however, normal free t4. We can repeat labs in 3-6 months; however, considered subclinical and is not treated with medications. - Labs show continued progression of post- menopause with decreased hormone levels. Continue to use sleep habits and journaling to identify trends and let us know how the trazodone is assisting.  Jacky Kindle, FNP  [facility] [address] [place], [place] [address] [phone] (phone) [phone] (fax) [facility]

## 2023-03-14 ENCOUNTER — Ambulatory Visit: Payer: 59 | Admitting: Family Medicine

## 2023-03-14 VITALS — BP 103/61 | HR 65 | Ht 64.0 in | Wt 159.9 lb

## 2023-03-14 DIAGNOSIS — Z0001 Encounter for general adult medical examination with abnormal findings: Secondary | ICD-10-CM | POA: Diagnosis not present

## 2023-03-14 DIAGNOSIS — Z1231 Encounter for screening mammogram for malignant neoplasm of breast: Secondary | ICD-10-CM

## 2023-03-14 DIAGNOSIS — E559 Vitamin D deficiency, unspecified: Secondary | ICD-10-CM | POA: Diagnosis not present

## 2023-03-14 DIAGNOSIS — N951 Menopausal and female climacteric states: Secondary | ICD-10-CM

## 2023-03-14 DIAGNOSIS — Z1211 Encounter for screening for malignant neoplasm of colon: Secondary | ICD-10-CM | POA: Insufficient documentation

## 2023-03-14 DIAGNOSIS — Z0000 Encounter for general adult medical examination without abnormal findings: Secondary | ICD-10-CM

## 2023-03-14 NOTE — Patient Instructions (Signed)
Please call and schedule your mammogram:  [facility]  [address], [address] [employer]  Main: [phone]

## 2023-03-14 NOTE — Progress Notes (Signed)
Complete physical exam  Patient: Anna Richmond   DOB: 1975/06/08   47 y.o. Female  MRN: [id number] Visit Date: 03/14/2023  Today's healthcare provider: Jacky Kindle, FNP  Re-introduced to nurse practitioner role and practice setting.  All questions answered.  Discussed provider/patient relationship and expectations.  Chief Complaint  Patient presents with   Annual Exam    Diet - General, well balanced more so towards vegetarian  Exercise - Walking daily for 30 minutes Feeling - well Sleeping - depends on the day, patient states "would say about 60/40 god/bad nights" Concerns - none    Subjective     HPI     Annual Exam    Additional comments: Diet - General, well balanced more so towards vegetarian  Exercise - Walking daily for 30 minutes Feeling - well Sleeping - depends on the day, patient states "would say about 60/40 god/bad nights" Concerns - none       Last edited by Acey Lav, CMA on 03/14/2023  2:49 PM.     Anna Richmond, a patient with a history of hot flashes managed with Nextstellis, presents for a routine check-up. She reports that her hot flashes have been well controlled with the new medication, which she obtained through a virtual provider. She has no new or worsening symptoms related to this condition. She also mentions that she has been placed on a management track at work, which has been a significant change for her.  In terms of preventive health, she is due for a mammogram in December, which she agrees to schedule. She expresses a preference for a colonoscopy over less invasive screening methods, citing a desire for thoroughness and early detection if any issues are present.  She also discusses her blood glucose levels, which were borderline elevated in the past, but her A1c was normal at 5.3. She does not express any concerns about this or request further testing. She also mentions a previous Pap smear in 2020, and is informed that she is  good through 2025 unless she has any concerns.  Past Medical History:  Diagnosis Date   Heart murmur    Substance abuse (HCC)    Thyroid disease    No past surgical history on file. Social History   Socioeconomic History   Marital status: Married    Spouse name: Not on file   Number of children: Not on file   Years of education: Not on file   Highest education level: Master's degree (e.g., MA, MS, MEng, MEd, MSW, MBA)  Occupational History   Not on file  Tobacco Use   Smoking status: Never   Smokeless tobacco: Never  Vaping Use   Vaping status: Never Used  Substance and Sexual Activity   Alcohol use: Yes    Alcohol/week: 1.0 standard drink of alcohol    Types: 1 Standard drinks or equivalent per week    Comment: One drink per week or less   Drug use: Never   Sexual activity: Yes    Birth control/protection: Pill  Other Topics Concern   Not on file  Social History Narrative   Not on file   Social Determinants of Health   Financial Resource Strain: Low Risk  (03/14/2023)   Overall Financial Resource Strain (CARDIA)    Difficulty of Paying Living Expenses: Not hard at all  Food Insecurity: No Food Insecurity (03/14/2023)   Hunger Vital Sign    Worried About Running Out of Food in the Last Year: Never true  Ran Out of Food in the Last Year: Never true  Transportation Needs: No Transportation Needs (03/14/2023)   PRAPARE - Administrator, Civil Service (Medical): No    Lack of Transportation (Non-Medical): No  Physical Activity: Insufficiently Active (03/14/2023)   Exercise Vital Sign    Days of Exercise per Week: 4 days    Minutes of Exercise per Session: 30 min  Stress: No Stress Concern Present (03/14/2023)   [employer] of Occupational Health - Occupational Stress Questionnaire    Feeling of Stress : Only a little  Social Connections: Socially Integrated (03/14/2023)   Social Connection and Isolation Panel [NHANES]    Frequency of  Communication with Friends and Family: Three times a week    Frequency of Social Gatherings with Friends and Family: Once a week    Attends Religious Services: More than 4 times per year    Active Member of [employer] or Organizations: Yes    Attends Engineer, structural: More than 4 times per year    Marital Status: Married  Catering manager Violence: Not on file   Family Status  Relation Name Status   Mother Britta Mccreedy Deceased at age 4       cancer lung   Father Bill Alive   MGM Grandma M Deceased   PGM Grandma A Deceased   PGF Grandpa H. Deceased   Brother Cletis [place] Alive   Brother [employer]  No partnership data on file   Family History  Problem Relation Age of Onset   Cancer Mother    Thyroid disease Mother    Asthma Mother    Hyperlipidemia Father    ADD / ADHD Father    Heart disease Father    Hypertension Maternal Grandmother    Hypertension Paternal Grandmother    Stroke Paternal Grandmother    Diabetes Paternal Grandfather    Dementia Paternal Grandfather    Alcohol abuse Paternal Actor    ADD / ADHD Brother    Learning disabilities Brother    ADD / ADHD Brother    Allergies  Allergen Reactions   Azithromycin Nausea And Vomiting   Doxycycline Itching and Rash   Sulfa Antibiotics Swelling and Rash    Patient Care Team: Jacky Kindle, FNP as PCP - General (Family Medicine)   Medications: Outpatient Medications Prior to Visit  Medication Sig   clobetasol ointment (TEMOVATE) 0.05 % Apply 1 Application topically 2 (two) times daily. Apply to dermatitis areas as needed; avoid axilla or groin folds   NEXTSTELLIS 3-14.2 MG TABS    traZODone (DESYREL) 50 MG tablet Take 0.5-1 tablets (25-50 mg total) by mouth at bedtime as needed for sleep.   No facility-administered medications prior to visit.    Objective    BP 103/61 (BP Location: Right Arm, Patient Position: Sitting, Cuff Size: Normal)   Pulse 65   Ht 5\' 4"  (1.626 m)   Wt 159 lb 14.4 oz (72.5 kg)    LMP 01/22/2022 (Approximate)   SpO2 99%   BMI 27.45 kg/m   Physical Exam Vitals and nursing note reviewed.  Constitutional:      General: She is awake. She is not in acute distress.    Appearance: Normal appearance. She is well-developed, well-groomed and overweight. She is not ill-appearing, toxic-appearing or diaphoretic.  HENT:     Head: Normocephalic and atraumatic.     Jaw: There is normal jaw occlusion. No trismus, tenderness, swelling or pain on movement.     Right Ear:  Hearing, tympanic membrane, ear canal and external ear normal. There is no impacted cerumen.     Left Ear: Hearing, tympanic membrane, ear canal and external ear normal. There is no impacted cerumen.     Nose: Nose normal. No congestion or rhinorrhea.     Right Turbinates: Not enlarged, swollen or pale.     Left Turbinates: Not enlarged, swollen or pale.     Right Sinus: No maxillary sinus tenderness or frontal sinus tenderness.     Left Sinus: No maxillary sinus tenderness or frontal sinus tenderness.     Mouth/Throat:     Lips: Pink.     Mouth: Mucous membranes are moist. No injury.     Tongue: No lesions.     Pharynx: Oropharynx is clear. Uvula midline. No pharyngeal swelling, oropharyngeal exudate, posterior oropharyngeal erythema or uvula swelling.     Tonsils: No tonsillar exudate or tonsillar abscesses.  Eyes:     General: Lids are normal. Lids are everted, no foreign bodies appreciated. Vision grossly intact. Gaze aligned appropriately. No allergic shiner or visual field deficit.       Right eye: No discharge.        Left eye: No discharge.     Extraocular Movements: Extraocular movements intact.     Conjunctiva/sclera: Conjunctivae normal.     Right eye: Right conjunctiva is not injected. No exudate.    Left eye: Left conjunctiva is not injected. No exudate.    Pupils: Pupils are equal, round, and reactive to light.  Neck:     Thyroid: No thyroid mass, thyromegaly or thyroid tenderness.      Vascular: No carotid bruit.     Trachea: Trachea normal.  Cardiovascular:     Rate and Rhythm: Normal rate and regular rhythm.     Pulses: Normal pulses.          Carotid pulses are 2+ on the right side and 2+ on the left side.      Radial pulses are 2+ on the right side and 2+ on the left side.       Dorsalis pedis pulses are 2+ on the right side and 2+ on the left side.       Posterior tibial pulses are 2+ on the right side and 2+ on the left side.     Heart sounds: Normal heart sounds, S1 normal and S2 normal. No murmur heard.    No friction rub. No gallop.  Pulmonary:     Effort: Pulmonary effort is normal. No respiratory distress.     Breath sounds: Normal breath sounds and air entry. No stridor. No wheezing, rhonchi or rales.  Chest:     Chest wall: No tenderness.  Abdominal:     General: Abdomen is flat. Bowel sounds are normal. There is no distension.     Palpations: Abdomen is soft. There is no mass.     Tenderness: There is no abdominal tenderness. There is no right CVA tenderness, left CVA tenderness, guarding or rebound.     Hernia: No hernia is present.  Genitourinary:    Comments: Exam deferred; denies complaints Musculoskeletal:        General: No swelling, tenderness, deformity or signs of injury. Normal range of motion.     Cervical back: Full passive range of motion without pain, normal range of motion and neck supple. No edema, rigidity or tenderness. No muscular tenderness.     Right lower leg: No edema.     Left lower leg: No  edema.  Lymphadenopathy:     Cervical: No cervical adenopathy.     Right cervical: No superficial, deep or posterior cervical adenopathy.    Left cervical: No superficial, deep or posterior cervical adenopathy.  Skin:    General: Skin is warm and dry.     Capillary Refill: Capillary refill takes less than 2 seconds.     Coloration: Skin is not jaundiced or pale.     Findings: No bruising, erythema, lesion or rash.  Neurological:      General: No focal deficit present.     Mental Status: She is alert and oriented to person, place, and time. Mental status is at baseline.     GCS: GCS eye subscore is 4. GCS verbal subscore is 5. GCS motor subscore is 6.     Sensory: Sensation is intact. No sensory deficit.     Motor: Motor function is intact. No weakness.     Coordination: Coordination is intact. Coordination normal.     Gait: Gait is intact. Gait normal.  Psychiatric:        Attention and Perception: Attention and perception normal.        Mood and Affect: Mood and affect normal.        Speech: Speech normal.        Behavior: Behavior normal. Behavior is cooperative.        Thought Content: Thought content normal.        Cognition and Memory: Cognition and memory normal.        Judgment: Judgment normal.     Last depression screening scores    08/31/2022   10:44 AM 03/11/2022    9:24 AM 01/16/2021    8:47 AM  PHQ 2/9 Scores  PHQ - 2 Score 0 0 0  PHQ- 9 Score 3 2 3    Last fall risk screening    08/31/2022   10:44 AM  Fall Risk   Falls in the past year? 0  Number falls in past yr: 0  Injury with Fall? 0  Risk for fall due to : No Fall Risks   Last Audit-C alcohol use screening    03/14/2023    1:30 PM  Alcohol Use Disorder Test (AUDIT)  1. How often do you have a drink containing alcohol? 2  2. How many drinks containing alcohol do you have on a typical day when you are drinking? 0  3. How often do you have six or more drinks on one occasion? 0  AUDIT-C Score 2   A score of 3 or more in women, and 4 or more in men indicates increased risk for alcohol abuse, EXCEPT if all of the points are from question 1   No results found for any visits on 03/14/23.  Assessment & Plan    Routine Health Maintenance and Physical Exam  Exercise Activities and Dietary recommendations  Goals   None     Immunization History  Administered Date(s) Administered   Influenza,inj,Quad PF,6+ Mos 03/30/2019, 03/09/2021    Influenza-Unspecified 04/22/2018, 02/09/2022, 02/28/2023   Moderna Sars-Covid-2 Vaccination 08/01/2019, 08/29/2019, 04/27/2020   Tdap 04/22/2018    Health Maintenance  Topic Date Due   Colonoscopy  Never done   COVID-19 Vaccine (4 - 2023-24 season) 01/23/2023   Cervical Cancer Screening (HPV/Pap Cotest)  11/10/2023   DTaP/Tdap/Td (2 - Td or Tdap) 04/22/2028   INFLUENZA VACCINE  Completed   Hepatitis C Screening  Completed   HIV Screening  Completed   HPV VACCINES  Aged Out    Discussed health benefits of physical activity, and encouraged her to engage in regular exercise appropriate for her age and condition.  General Health Maintenance Stable weight and blood pressure. No new complaints or concerns. -Schedule mammogram in December 2024. -Refer for colonoscopy for colon cancer screening. -Check Vitamin D levels.  Hormonal Changes Stable on Nextstellis. No new concerns. -Continue Nextstellis as prescribed by outside, telehealth provider, Lindon Romp  Dermatitis No current flare-ups or need for clobetasol ointment. -No need for refill at this time.  Insomnia Trazodone use as needed. No current need for refills. -Continue Trazodone as needed.  Cervical Cancer Screening Last Pap smear in 2020. No new concerns. -Next Pap smear due in 2025.  Return in about 1 year (around 03/13/2024), or if symptoms worsen or fail to improve, for annual examination.     Leilani Merl, FNP, have reviewed all documentation for this visit. The documentation on 03/14/23 for the exam, diagnosis, procedures, and orders are all accurate and complete.  Jacky Kindle, FNP  [facility] [phone] (phone) [phone] (fax)  [facility]

## 2023-03-15 ENCOUNTER — Telehealth: Payer: Self-pay

## 2023-03-15 DIAGNOSIS — Z1211 Encounter for screening for malignant neoplasm of colon: Secondary | ICD-10-CM

## 2023-03-15 LAB — CBC WITH DIFFERENTIAL/PLATELET
Basophils Absolute: 0.1 10*3/uL (ref 0.0–0.2)
Basos: 1 %
EOS (ABSOLUTE): 0.3 10*3/uL (ref 0.0–0.4)
Eos: 3 %
Hematocrit: 39.9 % (ref 34.0–46.6)
Hemoglobin: 13.3 g/dL (ref 11.1–15.9)
Immature Grans (Abs): 0.1 10*3/uL (ref 0.0–0.1)
Immature Granulocytes: 1 %
Lymphocytes Absolute: 3.2 10*3/uL — ABNORMAL HIGH (ref 0.7–3.1)
Lymphs: 37 %
MCH: 31.7 pg (ref 26.6–33.0)
MCHC: 33.3 g/dL (ref 31.5–35.7)
MCV: 95 fL (ref 79–97)
Monocytes Absolute: 0.7 10*3/uL (ref 0.1–0.9)
Monocytes: 8 %
Neutrophils Absolute: 4.3 10*3/uL (ref 1.4–7.0)
Neutrophils: 50 %
Platelets: 320 10*3/uL (ref 150–450)
RBC: 4.2 x10E6/uL (ref 3.77–5.28)
RDW: 12.6 % (ref 11.7–15.4)
WBC: 8.6 10*3/uL (ref 3.4–10.8)

## 2023-03-15 LAB — COMPREHENSIVE METABOLIC PANEL
ALT: 13 [IU]/L (ref 0–32)
AST: 18 [IU]/L (ref 0–40)
Albumin: 4.4 g/dL (ref 3.9–4.9)
Alkaline Phosphatase: 60 [IU]/L (ref 44–121)
BUN/Creatinine Ratio: 17 (ref 9–23)
BUN: 15 mg/dL (ref 6–24)
Bilirubin Total: 0.3 mg/dL (ref 0.0–1.2)
CO2: 21 mmol/L (ref 20–29)
Calcium: 9.8 mg/dL (ref 8.7–10.2)
Chloride: 105 mmol/L (ref 96–106)
Creatinine, Ser: 0.87 mg/dL (ref 0.57–1.00)
Globulin, Total: 2.5 g/dL (ref 1.5–4.5)
Glucose: 81 mg/dL (ref 70–99)
Potassium: 4.3 mmol/L (ref 3.5–5.2)
Sodium: 140 mmol/L (ref 134–144)
Total Protein: 6.9 g/dL (ref 6.0–8.5)
eGFR: 84 mL/min/{1.73_m2} (ref >59)

## 2023-03-15 LAB — LIPID PANEL WITH LDL/HDL RATIO
Cholesterol, Total: 188 mg/dL (ref 100–199)
HDL: 35 mg/dL — ABNORMAL LOW (ref >39)
LDL Chol Calc (NIH): 110 mg/dL — ABNORMAL HIGH (ref 0–99)
LDL/HDL Ratio: 3.1 ratio (ref 0.0–3.2)
Triglycerides: 250 mg/dL — ABNORMAL HIGH (ref 0–149)
VLDL Cholesterol Cal: 43 mg/dL — ABNORMAL HIGH (ref 5–40)

## 2023-03-15 LAB — VITAMIN D 25 HYDROXY (VIT D DEFICIENCY, FRACTURES): Vit D, 25-Hydroxy: 27.1 ng/mL — ABNORMAL LOW (ref 30.0–100.0)

## 2023-03-15 LAB — TSH: TSH: 4.42 u[IU]/mL (ref 0.450–4.500)

## 2023-03-15 NOTE — Telephone Encounter (Signed)
Gastroenterology Pre-Procedure Review  Pt will call office back in January to schedule colonoscopy due to busy end of the year work schedule.  Request Date: TBD Requesting Physician: Dr. Jodelle Gross  PATIENT REVIEW QUESTIONS: The patient responded to the following health history questions as indicated:    1. Are you having any GI issues? no 2. Do you have a personal history of Polyps? no 3. Do you have a family history of Colon Cancer or Polyps? Yes father colon polyps 4. Diabetes Mellitus? no 5. Joint replacements in the past 12 months?no 6. Major health problems in the past 3 months?no 7. Any artificial heart valves, MVP, or defibrillator?no    MEDICATIONS & ALLERGIES:    Patient reports the following regarding taking any anticoagulation/antiplatelet therapy:   Plavix, Coumadin, Eliquis, Xarelto, Lovenox, Pradaxa, Brilinta, or Effient? no Aspirin? no  Patient confirms/reports the following medications:  Current Outpatient Medications  Medication Sig Dispense Refill   clobetasol ointment (TEMOVATE) 0.05 % Apply 1 Application topically 2 (two) times daily. Apply to dermatitis areas as needed; avoid axilla or groin folds 60 g 0   NEXTSTELLIS 3-14.2 MG TABS      traZODone (DESYREL) 50 MG tablet Take 0.5-1 tablets (25-50 mg total) by mouth at bedtime as needed for sleep. 30 tablet 3   No current facility-administered medications for this visit.    Patient confirms/reports the following allergies:  Allergies  Allergen Reactions   Azithromycin Nausea And Vomiting   Doxycycline Itching and Rash   Sulfa Antibiotics Swelling and Rash    No orders of the defined types were placed in this encounter.   AUTHORIZATION INFORMATION Primary Insurance: 1D#: Group #:  Secondary Insurance: 1D#: Group #:  SCHEDULE INFORMATION: Date: TBD Time: Location: TBD

## 2023-03-20 ENCOUNTER — Encounter: Payer: Self-pay | Admitting: Family Medicine

## 2023-03-22 ENCOUNTER — Other Ambulatory Visit: Payer: Self-pay | Admitting: Family Medicine

## 2023-03-22 DIAGNOSIS — R5383 Other fatigue: Secondary | ICD-10-CM

## 2023-03-22 DIAGNOSIS — E038 Other specified hypothyroidism: Secondary | ICD-10-CM

## 2023-03-22 DIAGNOSIS — R4189 Other symptoms and signs involving cognitive functions and awareness: Secondary | ICD-10-CM

## 2023-04-29 ENCOUNTER — Encounter: Payer: Self-pay | Admitting: Radiology

## 2023-04-29 ENCOUNTER — Ambulatory Visit
Admission: RE | Admit: 2023-04-29 | Discharge: 2023-04-29 | Disposition: A | Discharge status: Home / Self Care | Payer: 59 | Source: Ambulatory Visit | Attending: Family Medicine | Admitting: Family Medicine

## 2023-04-29 DIAGNOSIS — Z1231 Encounter for screening mammogram for malignant neoplasm of breast: Secondary | ICD-10-CM | POA: Insufficient documentation

## 2023-08-02 ENCOUNTER — Ambulatory Visit: Payer: 59 | Admitting: Pediatrics

## 2023-08-02 VITALS — BP 93/64 | HR 75 | Ht 63.25 in | Wt 158.6 lb

## 2023-08-02 DIAGNOSIS — Z1330 Encounter for screening examination for mental health and behavioral disorders, unspecified: Secondary | ICD-10-CM | POA: Diagnosis not present

## 2023-08-02 DIAGNOSIS — N951 Menopausal and female climacteric states: Secondary | ICD-10-CM | POA: Diagnosis not present

## 2023-08-02 DIAGNOSIS — Z8639 Personal history of other endocrine, nutritional and metabolic disease: Secondary | ICD-10-CM | POA: Diagnosis not present

## 2023-08-02 DIAGNOSIS — L2089 Other atopic dermatitis: Secondary | ICD-10-CM | POA: Diagnosis not present

## 2023-08-02 DIAGNOSIS — Z7689 Persons encountering health services in other specified circumstances: Secondary | ICD-10-CM

## 2023-08-02 DIAGNOSIS — Z1211 Encounter for screening for malignant neoplasm of colon: Secondary | ICD-10-CM

## 2023-08-02 MED ORDER — CLOBETASOL PROPIONATE 0.05 % EX OINT: 1.0000 | TOPICAL_OINTMENT | Freq: Two times a day (BID) | CUTANEOUS | 0 refills | Status: AC

## 2023-08-02 NOTE — Assessment & Plan Note (Signed)
 Chronic eczema managed effectively with clobetasol. - Prescribe clobetasol ointment refill.

## 2023-08-02 NOTE — Patient Instructions (Signed)

## 2023-08-02 NOTE — Assessment & Plan Note (Signed)
 Mild symptoms, no treatment desired.

## 2023-08-02 NOTE — Assessment & Plan Note (Signed)
 Fluctuating thyroid function, previously hyperthyroid, currently off medication. - Order thyroid function tests.

## 2023-08-02 NOTE — Progress Notes (Signed)
 Establish Care Note  BP 93/64 (BP Location: Left Arm, Patient Position: Sitting, Cuff Size: Large)   Pulse 75   Ht 5' 3.25" (1.607 m)   Wt 158 lb 9.6 oz (71.9 kg)   SpO2 98%   BMI 27.87 kg/m    Subjective:    Patient ID: Anna Richmond, female    DOB: [birth year], 48 y.o.   MRN: [id number]  HPI: Anna Richmond is a 48 y.o. female  Chief Complaint  Patient presents with   Establish Care    Prior PCP recommended colonoscopy and pt never got around to it and would like to discuss    Labs Only    Would like to have thyroid checked     Establishing care, the following was discussed today:  Discussed the use of AI scribe software for clinical note transcription with the patient, who gave verbal consent to proceed.  History of Present Illness   The patient presents for lab work and a thyroid check due to a history of thyroid dysfunction.  She has a history of thyroid dysfunction, initially presenting as hyperthyroidism in her twenties. She has been on and off medication several times, but it has been a couple of years since she last took medication. She is currently due for a thyroid check to monitor her condition.  She is experiencing symptoms of perimenopause, primarily hot flashes and amenorrhea. The hot flashes have been intense in the past, affecting her sleep, but are currently mild and occur mostly in the morning. She tried birth control pills for a few months, which alleviated the hot flashes but caused depression, leading her to discontinue them. Her last menstrual period was in November. No current sleep disturbances due to hot flashes.  She uses clobetasol ointment for eczema, which typically affects her upper arms and occasionally her feet. She uses the ointment a couple of times per month, and one tube lasts a long time.  She has a family history of colon issues; her father had a colon resection due to an E. coli infection and diverticulitis. She  follows a mostly vegan diet, influenced by her husband's dietary habits, and reports regular bowel function.      #HM Will review HM records and updated as needed.  Relevant past medical, surgical, family and social history reviewed and updated as indicated. Interim medical history since our last visit reviewed. Allergies and medications reviewed and updated.  ROS per HPI unless specifically indicated above     Objective:    BP 93/64 (BP Location: Left Arm, Patient Position: Sitting, Cuff Size: Large)   Pulse 75   Ht 5' 3.25" (1.607 m)   Wt 158 lb 9.6 oz (71.9 kg)   SpO2 98%   BMI 27.87 kg/m   Wt Readings from Last 3 Encounters:  08/02/23 158 lb 9.6 oz (71.9 kg)  03/14/23 159 lb 14.4 oz (72.5 kg)  08/31/22 158 lb (71.7 kg)     Physical Exam Constitutional:      Appearance: Normal appearance.  HENT:     Head: Normocephalic and atraumatic.  Eyes:     Pupils: Pupils are equal, round, and reactive to light.  Neck:     Thyroid: No thyroid mass, thyromegaly or thyroid tenderness.  Cardiovascular:     Rate and Rhythm: Normal rate and regular rhythm.     Pulses: Normal pulses.     Heart sounds: Normal heart sounds.  Pulmonary:     Effort: Pulmonary effort is normal.  Breath sounds: Normal breath sounds.  Abdominal:     General: Abdomen is flat.     Palpations: Abdomen is soft.  Musculoskeletal:        General: Normal range of motion.     Cervical back: Normal range of motion.  Skin:    General: Skin is warm and dry.  Neurological:     General: No focal deficit present.     Mental Status: She is alert. Mental status is at baseline.  Psychiatric:        Mood and Affect: Mood normal.        Behavior: Behavior normal.         08/02/2023    8:48 AM 08/31/2022   10:44 AM 03/11/2022    9:24 AM 01/16/2021    8:47 AM 12/03/2019    3:09 PM  Depression screen PHQ 2/9  Decreased Interest 0 0 0 0 1  Down, Depressed, Hopeless 1 0 0 0 0  PHQ - 2 Score 1 0 0 0 1  Altered  sleeping 1 2 1 2    Tired, decreased energy 0 1 1 1    Change in appetite 0 0 0 0   Feeling bad or failure about yourself  0 0 0 0   Trouble concentrating 1 0 0 0   Moving slowly or fidgety/restless 0 0 0 0   Suicidal thoughts 0 0 0 0   PHQ-9 Score 3 3 2 3    Difficult doing work/chores  Somewhat difficult Not difficult at all Not difficult at all         08/02/2023    8:48 AM  GAD 7 : Generalized Anxiety Score  Nervous, Anxious, on Edge 1  Control/stop worrying 0  Worry too much - different things 1  Trouble relaxing 1  Restless 0  Easily annoyed or irritable 1  Afraid - awful might happen 1  Total GAD 7 Score 5       Assessment & Plan:  Assessment & Plan   Flexural atopic dermatitis Assessment & Plan: Chronic eczema managed effectively with clobetasol. - Prescribe clobetasol ointment refill.  Orders: -     Clobetasol Propionate; Apply 1 Application topically 2 (two) times daily. Apply to dermatitis areas as needed; avoid axilla or groin folds  Dispense: 60 g; Refill: 0  Perimenopausal vasomotor symptoms Assessment & Plan: Mild symptoms, no treatment desired.   History of hyperthyroidism Assessment & Plan: Fluctuating thyroid function, previously hyperthyroid, currently off medication. - Order thyroid function tests.  Orders: -     Thyroid Panel With TSH  Encounter for behavioral health screening As part of their intake evaluation, the patient was screened for depression, anxiety.  PHQ9 SCORE 3, GAD7 SCORE 5. Screening results negative for tested conditions. CTM.  Encounter to establish care Reviewed available patient record including history, medications, problem list. HM updated as able. Will review and/or request outside records (if applicable) and will fill remaining HM gaps as needed at follow up visit.   Screen for colon cancer Due for screening, family history noted, proceeding with colonoscopy. - Refer for colonoscopy. -     Ambulatory referral to  Gastroenterology   Follow up plan: Return in about 7 months (around 03/03/2024).  Jackolyn Confer, MD

## 2023-08-03 LAB — THYROID PANEL WITH TSH
Free Thyroxine Index: 1.5 (ref 1.2–4.9)
T3 Uptake Ratio: 26 % (ref 24–39)
T4, Total: 5.8 ug/dL (ref 4.5–12.0)
TSH: 4.89 u[IU]/mL — ABNORMAL HIGH (ref 0.450–4.500)

## 2023-08-09 ENCOUNTER — Other Ambulatory Visit: Payer: Self-pay | Admitting: Pediatrics

## 2023-08-09 ENCOUNTER — Telehealth: Payer: Self-pay

## 2023-08-09 DIAGNOSIS — E038 Other specified hypothyroidism: Secondary | ICD-10-CM

## 2023-08-09 NOTE — Progress Notes (Signed)
 Future thyroid panel order placed.

## 2023-08-09 NOTE — Telephone Encounter (Signed)
 Patient not ready to schedule. She has requested to schedule in August of this year.  Will send a copy of the instructions for her to have on hand should she have questions prior to scheduling in August.  Thanks, [place], [place]

## 2023-11-17 ENCOUNTER — Telehealth: Admitting: Physician Assistant

## 2023-11-17 DIAGNOSIS — L509 Urticaria, unspecified: Secondary | ICD-10-CM

## 2023-11-17 MED ORDER — FAMOTIDINE 20 MG PO TABS: 20.0000 mg | ORAL_TABLET | Freq: Two times a day (BID) | ORAL | 0 refills | Status: AC

## 2023-11-17 MED ORDER — HYDROXYZINE PAMOATE 25 MG PO CAPS: 25.0000 mg | ORAL_CAPSULE | Freq: Three times a day (TID) | ORAL | 0 refills | Status: AC | PRN

## 2023-11-17 NOTE — Progress Notes (Signed)
 Virtual Visit Consent   Anna Richmond, you are scheduled for a virtual visit with a [facility] provider today. Just as with appointments in the office, your consent must be obtained to participate. Your consent will be active for this visit and any virtual visit you may have with one of our providers in the next 365 days. If you have a MyChart account, a copy of this consent can be sent to you electronically.  As this is a virtual visit, video technology does not allow for your provider to perform a traditional examination. This may limit your provider's ability to fully assess your condition. If your provider identifies any concerns that need to be evaluated in person or the need to arrange testing (such as labs, EKG, etc.), we will make arrangements to do so. Although advances in technology are sophisticated, we cannot ensure that it will always work on either your end or our end. If the connection with a video visit is poor, the visit may have to be switched to a telephone visit. With either a video or telephone visit, we are not always able to ensure that we have a secure connection.  By engaging in this virtual visit, you consent to the provision of healthcare and authorize for your insurance to be billed (if applicable) for the services provided during this visit. Depending on your insurance coverage, you may receive a charge related to this service.  I need to obtain your verbal consent now. Are you willing to proceed with your visit today? Anna Richmond has provided verbal consent on 11/17/2023 for a virtual visit (video or telephone). Anna Richmond, [place]  Date: 11/17/2023 6:26 PM   Virtual Visit via Video Note   I, Anna Richmond, connected with  Anna Richmond  ([id number], June 06, 1975) on 11/17/23 at  6:15 PM EDT by a video-enabled telemedicine application and verified that I am speaking with the correct person using two  identifiers.  Location: Patient: Virtual Visit Location Patient: Home Provider: Virtual Visit Location Provider: Home Office   I discussed the limitations of evaluation and management by telemedicine and the availability of in person appointments. The patient expressed understanding and agreed to proceed.    History of Present Illness: Anna Richmond is a 48 y.o. who identifies as a female who was assigned female at birth, and is being seen today for hive-like rash first noted Wednesday afternoon.   Patient notes travel this past weekend. Had some mild diarrhea and thought was something she ate. Felt better by Sunday PM. Monday and Tuesday mild fatigue. Wednesday felt better until mid afternoon when she started feeling quite itchy, initially in the axillary regions with noted hives that spread across her extremities, torso and inguinal region. Notes husband got her some Allegra which she started along with cold showers and use of her Temovate  cream that she has for eczema, with some improvement in symptoms.  Better this morning until about lunch time/early afternoon -- then started to recur. No history of widespread rashes. Denies fever, chills, joint aches, headaches, facial swelling or SOB. Denies change to soaps, lotions, detergents or other hygiene products. Denies any new foods.   HPI: HPI  Problems:  Patient Active Problem List   Diagnosis Date Noted   History of hyperthyroidism 08/02/2023   Sleep disturbance 08/31/2022   Hypotension 08/31/2022   Perimenopausal vasomotor symptoms 03/11/2022   Flexural atopic dermatitis 03/11/2022    Allergies:  Allergies  Allergen Reactions   Azithromycin Nausea  And Vomiting   Doxycycline  Itching and Rash   Sulfa Antibiotics Swelling and Rash   Medications:  Current Outpatient Medications:    famotidine (PEPCID) 20 MG tablet, Take 1 tablet (20 mg total) by mouth 2 (two) times daily., Disp: 30 tablet, Rfl: 0   hydrOXYzine  (VISTARIL )  25 MG capsule, Take 1 capsule (25 mg total) by mouth every 8 (eight) hours as needed., Disp: 30 capsule, Rfl: 0   clobetasol  ointment (TEMOVATE ) 0.05 %, Apply 1 Application topically 2 (two) times daily. Apply to dermatitis areas as needed; avoid axilla or groin folds, Disp: 60 g, Rfl: 0  Observations/Objective: Patient is well-developed, well-nourished in no acute distress.  Resting comfortably at home.  Head is normocephalic, atraumatic.  No labored breathing. Speech is clear and coherent with logical content.  Patient is alert and oriented at baseline.  Residual urticarial lesions of upper extremities. Other areas not examined due to lack of chaperone.  Assessment and Plan: 1. Urticaria (Primary) - hydrOXYzine  (VISTARIL ) 25 MG capsule; Take 1 capsule (25 mg total) by mouth every 8 (eight) hours as needed.  Dispense: 30 capsule; Refill: 0 - famotidine (PEPCID) 20 MG tablet; Take 1 tablet (20 mg total) by mouth 2 (two) times daily.  Dispense: 30 tablet; Refill: 0  Seems most likely post-viral reaction. Supportive measures reviewed. Start Hydroxyzine  up to TID over next few days along with Famotidine BID. Once resolved for 24 hours without recurrence, she is to stop Hydroxyzine  and continue Famotidine twice daily for a few more days. If remaining resolved, she is to stop the Famotidine. PCP evaluation for any recurrence or any new symptoms despite treatment. ER precautions reviewed.  Follow Up Instructions: I discussed the assessment and treatment plan with the patient. The patient was provided an opportunity to ask questions and all were answered. The patient agreed with the plan and demonstrated an understanding of the instructions.  A copy of instructions were sent to the patient via MyChart unless otherwise noted below.    The patient was advised to call back or seek an in-person evaluation if the symptoms worsen or if the condition fails to improve as anticipated.    Anna Velma Lunger, PA-C

## 2023-11-17 NOTE — Patient Instructions (Signed)
  Berna Christine Klus, thank you for joining Elsie Velma Lunger, PA-C for today's virtual visit.  While this provider is not your primary care provider (PCP), if your PCP is located in our provider database this encounter information will be shared with them immediately following your visit.   [facility] MyChart account gives you access to today's visit and all your visits, tests, and labs performed at [facility]  click here if you don't have a [facility] MyChart account or go to [email or web address]  Consent: (Patient) Anna Richmond provided verbal consent for this virtual visit at the beginning of the encounter.  Current Medications:  Current Outpatient Medications:    famotidine (PEPCID) 20 MG tablet, Take 1 tablet (20 mg total) by mouth 2 (two) times daily., Disp: 30 tablet, Rfl: 0   hydrOXYzine  (VISTARIL ) 25 MG capsule, Take 1 capsule (25 mg total) by mouth every 8 (eight) hours as needed., Disp: 30 capsule, Rfl: 0   clobetasol  ointment (TEMOVATE ) 0.05 %, Apply 1 Application topically 2 (two) times daily. Apply to dermatitis areas as needed; avoid axilla or groin folds, Disp: 60 g, Rfl: 0   Medications ordered in this encounter:  Meds ordered this encounter  Medications   hydrOXYzine  (VISTARIL ) 25 MG capsule    Sig: Take 1 capsule (25 mg total) by mouth every 8 (eight) hours as needed.    Dispense:  30 capsule    Refill:  0    Supervising Provider:   BLAISE ALEENE KIDD [[id number]]   famotidine (PEPCID) 20 MG tablet    Sig: Take 1 tablet (20 mg total) by mouth 2 (two) times daily.    Dispense:  30 tablet    Refill:  0    Supervising Provider:   LAMPTEY, PHILIP O [[id number]]     *If you need refills on other medications prior to your next appointment, please contact your pharmacy*  Follow-Up: Call back or seek an in-person evaluation if the symptoms worsen or if the condition fails to improve as anticipated.  [facility] [phone]  Other Instructions Seems most likely post-viral reaction.  Start Hydroxyzine  every 8 hours over next few days along with Famotidine twice daily. Once resolved for 24 hours without recurrence,  stop Hydroxyzine  and continue Famotidine twice daily for a few more days.  If remaining resolved, stop the Famotidine.  If you note any non-resolving, new, or worsening symptoms despite treatment, please seek an in-person evaluation ASAP.   If you have been instructed to have an in-person evaluation today at a local Urgent Care facility, please use the link below. It will take you to a list of all of our available [facility], including address, phone number and hours of operation. Please do not delay care.  [facility]  If you or a family member do not have a primary care provider, use the link below to schedule a visit and establish care. When you choose a [facility] primary care physician or advanced practice provider, you gain a long-term partner in health. Find a Primary Care Provider  Learn more about [facility]'s in-office and virtual care options: [facility] - Get Care Now

## 2024-01-06 ENCOUNTER — Telehealth: Admitting: Physician Assistant

## 2024-01-06 DIAGNOSIS — H109 Unspecified conjunctivitis: Secondary | ICD-10-CM | POA: Diagnosis not present

## 2024-01-06 MED ORDER — MOXIFLOXACIN HCL 0.5 % OP SOLN: 1.0000 [drp] | Freq: Three times a day (TID) | OPHTHALMIC | 0 refills | Status: AC

## 2024-01-06 NOTE — Progress Notes (Signed)
 Virtual Visit Consent   Anna Richmond, you are scheduled for a virtual visit with a [facility] provider today. Just as with appointments in the office, your consent must be obtained to participate. Your consent will be active for this visit and any virtual visit you may have with one of our providers in the next 365 days. If you have a MyChart account, a copy of this consent can be sent to you electronically.  As this is a virtual visit, video technology does not allow for your provider to perform a traditional examination. This may limit your provider's ability to fully assess your condition. If your provider identifies any concerns that need to be evaluated in person or the need to arrange testing (such as labs, EKG, etc.), we will make arrangements to do so. Although advances in technology are sophisticated, we cannot ensure that it will always work on either your end or our end. If the connection with a video visit is poor, the visit may have to be switched to a telephone visit. With either a video or telephone visit, we are not always able to ensure that we have a secure connection.  By engaging in this virtual visit, you consent to the provision of healthcare and authorize for your insurance to be billed (if applicable) for the services provided during this visit. Depending on your insurance coverage, you may receive a charge related to this service.  I need to obtain your verbal consent now. Are you willing to proceed with your visit today? Landon Nira Visscher has provided verbal consent on 01/06/2024 for a virtual visit (video or telephone). Delon CHRISTELLA Dickinson, PA-C  Date: 01/06/2024 11:02 AM   Virtual Visit via Video Note   I, Delon CHRISTELLA Dickinson, connected with  Elinda Bunten  ([id number], [birth year]) on 01/06/24 at 10:45 AM EDT by a video-enabled telemedicine application and verified that I am speaking with the correct person using two  identifiers.  Location: Patient: Virtual Visit Location Patient: Home Provider: Virtual Visit Location Provider: Home Office   I discussed the limitations of evaluation and management by telemedicine and the availability of in person appointments. The patient expressed understanding and agreed to proceed.    History of Present Illness: Anna Richmond is a 48 y.o. who identifies as a female who was assigned female at birth, and is being seen today for possible pink eye.  HPI: Conjunctivitis  The current episode started yesterday. The onset was sudden. The problem occurs continuously. The problem has been gradually worsening. The problem is mild. Nothing relieves the symptoms. Nothing aggravates the symptoms. Associated symptoms include eye itching, rhinorrhea, eye discharge (goopy and weeping), eye pain and eye redness. Pertinent negatives include no fever, no decreased vision, no double vision, no photophobia and no headaches. The eye pain is mild. The right eye is affected. The eye pain is associated with movement. The eyelid exhibits swelling.     Problems:  Patient Active Problem List   Diagnosis Date Noted   History of hyperthyroidism 08/02/2023   Sleep disturbance 08/31/2022   Hypotension 08/31/2022   Perimenopausal vasomotor symptoms 03/11/2022   Flexural atopic dermatitis 03/11/2022    Allergies:  Allergies  Allergen Reactions   Azithromycin Nausea And Vomiting   Doxycycline  Itching and Rash   Sulfa Antibiotics Swelling and Rash   Medications:  Current Outpatient Medications:    moxifloxacin  (VIGAMOX ) 0.5 % ophthalmic solution, Place 1 drop into the right eye 3 (three) times daily for 5 days., Disp:  3 mL, Rfl: 0   clobetasol  ointment (TEMOVATE ) 0.05 %, Apply 1 Application topically 2 (two) times daily. Apply to dermatitis areas as needed; avoid axilla or groin folds, Disp: 60 g, Rfl: 0   famotidine  (PEPCID ) 20 MG tablet, Take 1 tablet (20 mg total) by mouth 2  (two) times daily., Disp: 30 tablet, Rfl: 0   hydrOXYzine  (VISTARIL ) 25 MG capsule, Take 1 capsule (25 mg total) by mouth every 8 (eight) hours as needed., Disp: 30 capsule, Rfl: 0  Observations/Objective: Patient is well-developed, well-nourished in no acute distress.  Resting comfortably at home.  Head is normocephalic, atraumatic.  No labored breathing.  Speech is clear and coherent with logical content.  Patient is alert and oriented at baseline.  Right eye is injected, upper lid with mild swelling and redness. EOM grossly intact, Pupils are round and equal.  Assessment and Plan: 1. Bacterial conjunctivitis (Primary) - moxifloxacin  (VIGAMOX ) 0.5 % ophthalmic solution; Place 1 drop into the right eye 3 (three) times daily for 5 days.  Dispense: 3 mL; Refill: 0  - Suspect bacterial conjunctivitis - Vigamox  prescribed - Warm compresses - Good hand hygiene - Seek in person evaluation if symptoms worsen or fail to improve   Follow Up Instructions: I discussed the assessment and treatment plan with the patient. The patient was provided an opportunity to ask questions and all were answered. The patient agreed with the plan and demonstrated an understanding of the instructions.  A copy of instructions were sent to the patient via MyChart unless otherwise noted below.    The patient was advised to call back or seek an in-person evaluation if the symptoms worsen or if the condition fails to improve as anticipated.    Delon CHRISTELLA Dickinson, PA-C

## 2024-01-06 NOTE — Patient Instructions (Signed)
 Anna Richmond, thank you for joining Delon CHRISTELLA Dickinson, PA-C for today's virtual visit.  While this provider is not your primary care provider (PCP), if your PCP is located in our provider database this encounter information will be shared with them immediately following your visit.   A [facility] MyChart account gives you access to today's visit and all your visits, tests, and labs performed at [facility]  click here if you don't have a [facility] MyChart account or go to [email or web address]  Consent: (Patient) Anna Richmond provided verbal consent for this virtual visit at the beginning of the encounter.  Current Medications:  Current Outpatient Medications:    moxifloxacin  (VIGAMOX ) 0.5 % ophthalmic solution, Place 1 drop into the right eye 3 (three) times daily for 5 days., Disp: 3 mL, Rfl: 0   clobetasol  ointment (TEMOVATE ) 0.05 %, Apply 1 Application topically 2 (two) times daily. Apply to dermatitis areas as needed; avoid axilla or groin folds, Disp: 60 g, Rfl: 0   famotidine  (PEPCID ) 20 MG tablet, Take 1 tablet (20 mg total) by mouth 2 (two) times daily., Disp: 30 tablet, Rfl: 0   hydrOXYzine  (VISTARIL ) 25 MG capsule, Take 1 capsule (25 mg total) by mouth every 8 (eight) hours as needed., Disp: 30 capsule, Rfl: 0   Medications ordered in this encounter:  Meds ordered this encounter  Medications   moxifloxacin  (VIGAMOX ) 0.5 % ophthalmic solution    Sig: Place 1 drop into the right eye 3 (three) times daily for 5 days.    Dispense:  3 mL    Refill:  0    Supervising Provider:   BLAISE ALEENE KIDD [[id number]]     *If you need refills on other medications prior to your next appointment, please contact your pharmacy*  Follow-Up: Call back or seek an in-person evaluation if the symptoms worsen or if the condition fails to improve as anticipated.   Virtual Care [phone]  Other Instructions Bacterial Conjunctivitis,  Adult Bacterial conjunctivitis is an infection of the clear membrane that covers the white part of the eye and the inner surface of the eyelid (conjunctiva). When the blood vessels in the conjunctiva become inflamed, the eye becomes red or pink. The eye often feels irritated or itchy. Bacterial conjunctivitis spreads easily from person to person (is contagious). It also spreads easily from one eye to the other eye. What are the causes? This condition is caused by bacteria. You may get the infection if you come into close contact with: A person who is infected with the bacteria. Items that are contaminated with the bacteria, such as a face towel, contact lens solution, or eye makeup. What increases the risk? You are more likely to develop this condition if: You are exposed to other people who have the infection. You wear contact lenses. You have a sinus infection. You have had a recent eye injury or surgery. You have a weak body defense system (immune system). You have a medical condition that causes dry eyes. What are the signs or symptoms? Symptoms of this condition include: Thick, yellowish discharge from the eye. This may turn into a crust on the eyelid overnight and cause your eyelids to stick together. Tearing or watery eyes. Itchy eyes. Burning feeling in your eyes. Eye redness. Swollen eyelids. Blurred vision. How is this diagnosed? This condition is diagnosed based on your symptoms and medical history. Your health care provider may also take a sample of discharge from your eye to find the  cause of your infection. How is this treated? This condition may be treated with: Antibiotic eye drops or ointment to clear the infection more quickly and prevent the spread of infection to others. Antibiotic medicines taken by mouth (orally) to treat infections that do not respond to drops or ointments or that last longer than 10 days. Cool, wet cloths (cool compresses) placed on the  eyes. Artificial tears applied 2-6 times a day. Follow these instructions at home: Medicines Take or apply your antibiotic medicine as told by your health care provider. Do not stop using the antibiotic, even if your condition improves, unless directed by your health care provider. Take or apply over-the-counter and prescription medicines only as told by your health care provider. Be very careful to avoid touching the edge of your eyelid with the eye-drop bottle or the ointment tube when you apply medicines to the affected eye. This will keep you from spreading the infection to your other eye or to other people. Managing discomfort Gently wipe away any drainage from your eye with a warm, wet washcloth or a cotton ball. Apply a clean, cool compress to your eye for 10-20 minutes, 3-4 times a day. General instructions Do not wear contact lenses until the inflammation is gone and your health care provider says it is safe to wear them again. Ask your health care provider how to sterilize or replace your contact lenses before you use them again. Wear glasses until you can resume wearing contact lenses. Avoid wearing eye makeup until the inflammation is gone. Throw away any old eye cosmetics that may be contaminated. Change or wash your pillowcase every day. Do not share towels or washcloths. This may spread the infection. Wash your hands often with soap and water for at least 20 seconds and especially before touching your face or eyes. Use paper towels to dry your hands. Avoid touching or rubbing your eyes. Do not drive or use heavy machinery if your vision is blurred. Contact a health care provider if: You have a fever. Your symptoms do not get better after 10 days. Get help right away if: You have a fever and your symptoms suddenly get worse. You have severe pain when you move your eye. You have facial pain, redness, or swelling. You have a sudden loss of vision. Summary Bacterial  conjunctivitis is an infection of the clear membrane that covers the white part of the eye and the inner surface of the eyelid (conjunctiva). Bacterial conjunctivitis spreads easily from eye to eye and from person to person (is contagious). Wash your hands often with soap and water for at least 20 seconds and especially before touching your face or eyes. Use paper towels to dry your hands. Take or apply your antibiotic medicine as told by your health care provider. Do not stop using the antibiotic even if your condition improves. Contact a health care provider if you have a fever or if your symptoms do not get better after 10 days. Get help right away if you have a sudden loss of vision. This information is not intended to replace advice given to you by your health care provider. Make sure you discuss any questions you have with your health care provider. Document Revised: 08/20/2020 Document Reviewed: 08/20/2020 Elsevier Patient Education  2024 Elsevier Inc.   If you have been instructed to have an in-person evaluation today at a local Urgent Care facility, please use the link below. It will take you to a list of all of our available  [facility], including address, phone number and hours of operation. Please do not delay care.  [facility]  If you or a family member do not have a primary care provider, use the link below to schedule a visit and establish care. When you choose a [facility] primary care physician or advanced practice provider, you gain a long-term partner in health. Find a Primary Care Provider  Learn more about [facility] in-office and virtual care options:  - Get Care Now

## 2024-03-07 ENCOUNTER — Ambulatory Visit: Admitting: Pediatrics

## 2024-03-20 ENCOUNTER — Encounter: Admitting: Pediatrics

## 2024-04-06 ENCOUNTER — Other Ambulatory Visit: Payer: Self-pay | Admitting: Family Medicine

## 2024-04-06 DIAGNOSIS — Z1231 Encounter for screening mammogram for malignant neoplasm of breast: Secondary | ICD-10-CM

## 2024-05-07 ENCOUNTER — Telehealth: Payer: Self-pay

## 2024-05-07 ENCOUNTER — Other Ambulatory Visit: Payer: Self-pay

## 2024-05-07 DIAGNOSIS — R195 Other fecal abnormalities: Secondary | ICD-10-CM

## 2024-05-07 DIAGNOSIS — Z1211 Encounter for screening for malignant neoplasm of colon: Secondary | ICD-10-CM

## 2024-05-07 MED ORDER — NA SULFATE-K SULFATE-MG SULF 17.5-3.13-1.6 GM/177ML PO SOLN: 1.0000 | Freq: Once | ORAL | 0 refills | Status: AC

## 2024-05-07 NOTE — Telephone Encounter (Signed)
 Gastroenterology Pre-Procedure Review  Request Date: 06/18/24 Requesting Physician: Dr. Melany  PATIENT REVIEW QUESTIONS: The patient responded to the following health history questions as indicated:    1. Are you having any GI issues? no GI Issues Referral noted positive cologuard 2. Do you have a personal history of Polyps? no 3. Do you have a family history of Colon Cancer or Polyps? Father diverticulitis had colon resection 4. Diabetes Mellitus? no 5. Joint replacements in the past 12 months?no 6. Major health problems in the past 3 months?no 7. Any artificial heart valves, MVP, or defibrillator?no    MEDICATIONS & ALLERGIES:    Patient reports the following regarding taking any anticoagulation/antiplatelet therapy:   Plavix, Coumadin, Eliquis, Xarelto, Lovenox, Pradaxa, Brilinta, or Effient? no Aspirin? no  Patient confirms/reports the following medications:  Current Outpatient Medications  Medication Sig Dispense Refill   clobetasol  ointment (TEMOVATE ) 0.05 % Apply 1 Application topically 2 (two) times daily. Apply to dermatitis areas as needed; avoid axilla or groin folds 60 g 0   famotidine  (PEPCID ) 20 MG tablet Take 1 tablet (20 mg total) by mouth 2 (two) times daily. 30 tablet 0   hydrOXYzine  (VISTARIL ) 25 MG capsule Take 1 capsule (25 mg total) by mouth every 8 (eight) hours as needed. 30 capsule 0   No current facility-administered medications for this visit.    Patient confirms/reports the following allergies:  Allergies[1]  No orders of the defined types were placed in this encounter.   AUTHORIZATION INFORMATION Primary Insurance: 1D#: Group #:  Secondary Insurance: 1D#: Group #:  SCHEDULE INFORMATION: Date: 06/18/24 Time: Location: [facility]    [1]  Allergies Allergen Reactions   Azithromycin Nausea And Vomiting   Doxycycline  Itching and Rash   Sulfa Antibiotics Swelling and Rash

## 2024-05-15 ENCOUNTER — Ambulatory Visit
Admission: RE | Admit: 2024-05-15 | Discharge: 2024-05-15 | Disposition: A | Discharge status: Home / Self Care | Source: Ambulatory Visit | Attending: Family Medicine | Admitting: Family Medicine

## 2024-05-15 DIAGNOSIS — Z1231 Encounter for screening mammogram for malignant neoplasm of breast: Secondary | ICD-10-CM | POA: Diagnosis present

## 2024-06-13 ENCOUNTER — Telehealth: Payer: Self-pay

## 2024-06-13 NOTE — Telephone Encounter (Signed)
 Patient currently scheduled 06/18/24 w/Dr.Scholl mebane for procedures-however she is cancelling and wants to reschedule-spoke with Luke and moved patient to 07-09-24 . Patient did not give reason.

## 2024-07-09 ENCOUNTER — Encounter: Admission: RE | Payer: Self-pay | Source: Home / Self Care

## 2024-07-09 ENCOUNTER — Ambulatory Visit: Admission: RE | Admit: 2024-07-09 | Source: Home / Self Care | Admitting: Gastroenterology

## 2024-07-09 SURGERY — COLONOSCOPY
Anesthesia: Choice
